# Patient Record
Sex: Female | Born: 1970 | Race: White | Hispanic: No | Marital: Married | Smoking: Never smoker
Health system: Southern US, Community
[De-identification: ages and names within clinical notes are randomized; demographics above are authoritative.]

## PROBLEM LIST (undated history)

## (undated) DIAGNOSIS — D219 Benign neoplasm of connective and other soft tissue, unspecified: Secondary | ICD-10-CM

## (undated) DIAGNOSIS — R194 Change in bowel habit: Secondary | ICD-10-CM

## (undated) DIAGNOSIS — E119 Type 2 diabetes mellitus without complications: Secondary | ICD-10-CM

## (undated) DIAGNOSIS — I10 Essential (primary) hypertension: Secondary | ICD-10-CM

## (undated) DIAGNOSIS — N949 Unspecified condition associated with female genital organs and menstrual cycle: Principal | ICD-10-CM

## (undated) DIAGNOSIS — R112 Nausea with vomiting, unspecified: Secondary | ICD-10-CM

## (undated) DIAGNOSIS — N200 Calculus of kidney: Secondary | ICD-10-CM

## (undated) DIAGNOSIS — Z9889 Other specified postprocedural states: Secondary | ICD-10-CM

## (undated) HISTORY — DX: Change in bowel habit: R19.4

## (undated) HISTORY — PX: TONSILLECTOMY AND ADENOIDECTOMY: SHX28

## (undated) HISTORY — DX: Calculus of kidney: N20.0

## (undated) HISTORY — DX: Type 2 diabetes mellitus without complications: E11.9

## (undated) HISTORY — DX: Unspecified condition associated with female genital organs and menstrual cycle: N94.9

## (undated) HISTORY — DX: Essential (primary) hypertension: I10

## (undated) HISTORY — DX: Benign neoplasm of connective and other soft tissue, unspecified: D21.9

## (undated) HISTORY — PX: APPENDECTOMY: SHX54

## (undated) HISTORY — PX: BREAST BIOPSY: SHX20

## (undated) HISTORY — PX: ENDOMETRIAL ABLATION: SHX621

---

## 2000-10-22 ENCOUNTER — Other Ambulatory Visit: Admission: RE | Admit: 2000-10-22 | Discharge: 2000-10-22 | Payer: Self-pay | Admitting: Obstetrics and Gynecology

## 2001-01-13 ENCOUNTER — Ambulatory Visit (HOSPITAL_COMMUNITY): Admission: AD | Admit: 2001-01-13 | Discharge: 2001-01-13 | Payer: Self-pay | Admitting: Obstetrics and Gynecology

## 2001-04-30 ENCOUNTER — Inpatient Hospital Stay (HOSPITAL_COMMUNITY): Admission: AD | Admit: 2001-04-30 | Discharge: 2001-05-02 | Payer: Self-pay | Admitting: Obstetrics and Gynecology

## 2001-05-31 ENCOUNTER — Ambulatory Visit (HOSPITAL_COMMUNITY): Admission: RE | Admit: 2001-05-31 | Discharge: 2001-05-31 | Payer: Self-pay | Admitting: Obstetrics and Gynecology

## 2004-08-17 ENCOUNTER — Ambulatory Visit (HOSPITAL_COMMUNITY): Admission: RE | Admit: 2004-08-17 | Discharge: 2004-08-17 | Payer: Self-pay | Admitting: Obstetrics & Gynecology

## 2005-05-19 ENCOUNTER — Ambulatory Visit (HOSPITAL_COMMUNITY): Admission: RE | Admit: 2005-05-19 | Discharge: 2005-05-19 | Payer: Self-pay | Admitting: Obstetrics & Gynecology

## 2006-06-22 ENCOUNTER — Ambulatory Visit (HOSPITAL_COMMUNITY): Admission: RE | Admit: 2006-06-22 | Discharge: 2006-06-22 | Payer: Self-pay | Admitting: Obstetrics and Gynecology

## 2007-07-23 ENCOUNTER — Other Ambulatory Visit: Admission: RE | Admit: 2007-07-23 | Discharge: 2007-07-23 | Payer: Self-pay | Admitting: Obstetrics and Gynecology

## 2008-03-15 ENCOUNTER — Emergency Department (HOSPITAL_COMMUNITY): Admission: EM | Admit: 2008-03-15 | Discharge: 2008-03-16 | Payer: Self-pay | Admitting: Emergency Medicine

## 2008-10-07 ENCOUNTER — Other Ambulatory Visit: Admission: RE | Admit: 2008-10-07 | Discharge: 2008-10-07 | Payer: Self-pay | Admitting: Obstetrics and Gynecology

## 2008-10-19 ENCOUNTER — Ambulatory Visit (HOSPITAL_COMMUNITY): Admission: RE | Admit: 2008-10-19 | Discharge: 2008-10-19 | Payer: Self-pay | Admitting: Pulmonary Disease

## 2008-10-19 ENCOUNTER — Ambulatory Visit (HOSPITAL_COMMUNITY): Admission: RE | Admit: 2008-10-19 | Discharge: 2008-10-19 | Payer: Self-pay | Admitting: Obstetrics and Gynecology

## 2010-05-30 ENCOUNTER — Ambulatory Visit (HOSPITAL_COMMUNITY): Admission: RE | Admit: 2010-05-30 | Discharge: 2010-05-30 | Payer: Self-pay | Admitting: Pulmonary Disease

## 2010-08-02 ENCOUNTER — Other Ambulatory Visit
Admission: RE | Admit: 2010-08-02 | Discharge: 2010-08-02 | Payer: Self-pay | Source: Home / Self Care | Admitting: Obstetrics and Gynecology

## 2010-08-02 ENCOUNTER — Other Ambulatory Visit: Payer: Self-pay | Admitting: Adult Health

## 2010-10-19 LAB — BLOOD GAS, ARTERIAL
Acid-base deficit: 2.8 mmol/L — ABNORMAL HIGH (ref 0.0–2.0)
Bicarbonate: 20.7 mEq/L (ref 20.0–24.0)
O2 Saturation: 98.3 %
Patient temperature: 37
TCO2: 17.6 mmol/L (ref 0–100)
pCO2 arterial: 30.8 mmHg — ABNORMAL LOW (ref 35.0–45.0)
pH, Arterial: 7.442 — ABNORMAL HIGH (ref 7.350–7.400)
pO2, Arterial: 109 mmHg — ABNORMAL HIGH (ref 80.0–100.0)

## 2010-11-25 NOTE — Op Note (Signed)
[facility]  Patient:    Dana Lynch, Dana Lynch Visit Number: [id number] MRN: [id number]          Service Type: OBS Location: 4A A427 01 Attending Physician:  Tilda Burrow Dictated by:   Hunt Oris, C.N.M. Proc. Date: 04/30/01 Admit Date:  04/30/2001                             Operative Report  PROCEDURE:  Delivery.  DETAILS OF PROCEDURE: Dana Lynch became fully dilated with an urge to push at approximately 1535.  After a brief second stage, she delivered a viable female infant at 26 with a nuchal cord and body cord x 1 which were easily reduced. The shoulders were delivered with some assistance being given utilizing traction under the posterior (left) axilla; 20 cm of cord was left attached to the baby, and the cord was doubly clamped and cut.  The infant was taken to the radiant warmer for resuscitation.  Apgars were 8/9, and weight 8 pounds 14 ounces.  Pitocin 20 units diluted in 1000 cc of lactated Ringers was then infused rapidly IV.  While awaiting the separation and expulsion of the placenta, 10 cc of 1% Xylocaine was infiltrated into a first degree perineal laceration, and repair was made using several stitches of 2-0 Vicryl. ictated by:   Hunt Oris, C.N.M. Attending Physician:  Tilda Burrow DD:  04/30/01 TD:  05/01/01 Job: [id number]

## 2010-11-25 NOTE — Op Note (Signed)
[facility]  Patient:    Dana Lynch, Dana Lynch Visit Number: [id number] MRN: [id number]          Service Type: DSU Location: DAY Attending Physician:  Tilda Burrow Dictated by:   Christin Bach, M.D. Admit Date:  05/31/2001                             Operative Report  PREOPERATIVE DIAGNOSES:  Elective sterilization.  POSTOPERATIVE DIAGNOSES:  Elective sterilization.  PROCEDURE:  Laparoscopic tubal sterilization with Falope rings.  SURGEON:  Christin Bach, M.D.  ASSISTANT:  None.  ANESTHESIA:  General -- Idacavage, C.R.N.A.  COMPLICATIONS:  None.  FINDINGS:  Normal uterus, tubes and ovaries.  INDICATION:  Elective permanent sterilization.  DETAILS OF PROCEDURE:  The patient was taken to the operating room, prepped and draped for a combined abdominal and vaginal procedure, with Hulka tenaculum attached to the cervix for uterine manipulation. Bladder in-and-out catheterization. An infraumbilical, 1 cm vertical incision, as well as a transverse suprapubic 1 cm incision. Veress needle was used to introduce pneumoperitoneum through the umbilical incision with the pneumoperitoneum easily introduced under 10 mmHg of pressure. Introduction of the Veress needle was done, carefully elevating the abdominal wall and orienting the needle toward the pelvis.  The laparoscopic trocar was then carefully introduced into the abdomen using a similar technique, and the laparoscope was used to visualize normal pelvic anatomy with no evidence of bleeding or trauma. The suprapubic trocar was introduced under direct visualization, and then attention was directed to the left fallopian tube, which was identified up to its fimbriated end, elevated and a mid-segment loop of the tube was drawn up into the Falope ring applier, Marcaine 0.25% applied to the surface of the tube and the Falope ring applied, inspected, and found to be in satisfactory position. The  opposite tube was then treated in a similar fashion. The mesosalpinx beneath the Falope ring on each side was then infiltrated with approximately 3 cc of Marcaine 0.25%, using a transabdominal approach with a 22-gauge spinal needle.  Then, the laparoscopic equipment was removed after instilling 200 cc of saline into the abdomen and deflating the abdomen. Subcuticular 4-0 Dexon was used to close the skin incisions and Steri-Strips was placed on the skin surface. Sponge and needle counts were correct. The patient tolerated the procedure well, was awakened, and went to the recovery room in good condition. Dictated by:   Christin Bach, M.D. Attending Physician:  Tilda Burrow DD:  05/31/01 TD:  05/31/01 Job: [id number]

## 2010-11-25 NOTE — H&P (Signed)
NAME:  Dana Lynch           ACCOUNT NO.:  [id number]   MEDICAL RECORD NO.:  [id number]          PATIENT TYPE:  AMB   LOCATION:  DAY                           FACILITY:  [facility]   PHYSICIAN:  Lazaro Arms, M.D.   DATE OF BIRTH:  1970/09/24   DATE OF ADMISSION:  DATE OF DISCHARGE:  LH                                HISTORY & PHYSICAL   Dana Lynch is a 40 year old white female, gravida 3, para 3, status post tubal  whom we saw several times here recently, most recently on July 19, 2004,  for increasing menometrorrhagia and dysmenorrhea.  She bleeds for 5 or 6  days clotting.  She has to wear a super maxi pad and changes them every  hour.  She soils her clothes and sheets and has to wear a towel at night.  Again, she has a great deal of cramping with it.  We discussed options of  brth control pills versus endometrial ablation, and the patient does very  poorly on birth control pills.  That is why she had a tubal.  As a result,  she is opting for endometrial ablation. She has been premedicated on Megace  and has been amenorrheic on that.   PAST MEDICAL HISTORY:  negative   PAST SURGICAL HISTORY:  Appendix, tonsils, and tubal.   PAST OB HISTORY:  Three vaginal deliveries.   REVIEW OF SYSTEMS:  Negative.   ALLERGIES:  None.   MEDICATIONS:  Megace.   REVIEW OF SYSTEMS:  Otherwise negative.   SOCIAL HISTORY:  The patient does not smoke, drink, or do drugs.  She is a  Runner, broadcasting/film/video.   PHYSICAL EXAMINATION:  HEENT:  Unremarkable.  NECK:  thyroid is normal.  LUNGS: Clear.  HEART:  Regular rate and rhythm without murmur, regurgitation, or gallop.  BREASTS:  Without mass, discharge, or skin changes.  ABDOMEN:  Benign.  No hepatosplenomegaly or masses.  PELVIC:  She has normal external genitalia.  Vagina pink and moist without  discharge.  Cervix is parous without lesions.  Uterus is normal size, shape,  and contour.  Ovaries normal and nontender.  EXTREMITIES:  Warm with no edema.  NEUROLOGIC:  Grossly intact.   IMPRESSION:  1.  Menometrorrhagia.  2.  Dysmenorrhea.   PLAN:  The patient is admitted for hysteroscopy D&C with endometrial  ablation.  She understands the risks, benefits, indications, and  alternatives and will proceed.      LHE/MEDQ  D:  08/16/2004  T:  08/16/2004  Job:  [id number]   cc:   [facility]  Fax: [phone]

## 2010-11-25 NOTE — Op Note (Signed)
Dana Lynch, Dana Lynch           ACCOUNT NO.:  [id number]   MEDICAL RECORD NO.:  [id number]          PATIENT TYPE:  AMB   LOCATION:  DAY                           FACILITY:  [facility]   PHYSICIAN:  Lazaro Arms, M.D.   DATE OF BIRTH:  1970/07/15   DATE OF PROCEDURE:  DATE OF DISCHARGE:                                 OPERATIVE REPORT   PREOPERATIVE DIAGNOSES:  1.  Menometrorrhagia.  2.  Dysmenorrhea.   POSTOPERATIVE DIAGNOSES:  1.  Menometrorrhagia.  2.  Dysmenorrhea.  3.  Endometrial polyp.   PROCEDURE:  1.  Hysteroscopy.  2.  Dilatation and curettage.  3.  Endometrial ablation.   SURGEON:  Lazaro Arms, M.D.   ANESTHESIA:  Laryngeal mask airway.   FINDINGS:  The patient had normal endometrium except for a small polyp.  It  could have been a fibroid, but may be a polyp.  The rest of the endometrial  cavity was normal.   DESCRIPTION OF PROCEDURE:  The patient was taken to the operating room and  placed in the supine position where she underwent laryngeal mask airway;  placed in the dorsal lithotomy position; prepped and draped in the usual  sterile fashion.  A Graves speculum was placed.  The cervix was grasped.  A  paracervical block using 1/2% Marcaine was placed.  The cervix was dilated  serially to allow passage of the Hydrothermablator hysteroscope.  The above-  noted findings were seen.  A vigorous curettage was performed.  All  specimens were sent to pathology for evaluation.   The Hydrothermablator was then used, taken up to 80 degrees Celsius and a  heating cycle of 10 minutes.  There was no loss of fluid during the  procedure.  The procedure went well without complications.  The instruments  were  removed.  The patient was awakened from anesthesia and taken to the recovery  room in good stable condition.  There was approximately 50 mL of blood at  the most.  The patient tolerated the procedure well.  She received Ancef and  Toradol prophylactically prior to  the procedure.      LHE/MEDQ  D:  08/17/2004  T:  08/17/2004  Job:  [id number]

## 2010-11-25 NOTE — H&P (Signed)
[facility]  Patient:    Dana Lynch, Dana Lynch Visit Number: [id number] MRN: [id number]          Service Type: OBS Location: 4A A427 01 Attending Physician:  Tilda Burrow Dictated by:   Zerita Boers, CNM Admit Date:  04/30/2001 Discharge Date: 05/02/2001                           History and Physical  ADMITTING DIAGNOSES:  Pregnancy at 37 weeks and 3 days with early labor.  HISTORY OF PRESENT ILLNESS:  Dana Lynch is a gravida 3, para 2 with a EDC of November 9 which is consistent with early ultrasound being admitted with complaints of irregular uterine contractions during the course of the night. Upon examination in the office, she is 3-4 cm, 70% effaced, -2 station. Also this morning she had an ultrasound fresh made fetal weight. Estimated fetal weight is approximately 10.2 pounds per abdominal ultrasound.  PAST MEDICAL HISTORY:  Positive for large babies. First baby was 8 pounds 11 ounces, second baby 9.5 pounds. Other than that, medical history is benign.  PAST SURGICAL HISTORY:  Positive for tonsils and appendectomy.  ALLERGIES:  She has no known allergies.  MEDICATIONS:  She is prenatal vitamins.  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  OBSTETRICAL HISTORY:  Blood type is O+, ______ is negative, rubella is immune. Hepatitis B surface antigen is negative. HIV is negative. Serology is nonreactive. GC and chlamydia are both negative. ______ within normal limits. A 28 week hemoglobin 13.7, hematocrit 40.3, one hour glucose tolerance is 102. GBS was negative. She does have a history of large babies and estimated fetal weight for this infant is 10.2 pounds per abdominal ultrasound on October 22. Prenatal course is essentially uneventful.  PHYSICAL EXAMINATION:  VITAL SIGNS:  Stable. Weight today was approximately 240 pounds. Estimated fetal weight 10.2 pounds.  PLAN:  We are going to  admit and AROM and possible Pitocin augmentation of labor. Dr. Emelda Fear was consulted and is aware of patient and he plans on being present. Report was given to oncoming nurse midwife Ella Bodo and she is aware of the patients history of macrosomic infants. Dictated by:   Zerita Boers, CNM Attending Physician:  Tilda Burrow DD:  04/30/01 TD:  04/30/01 Job: [id number]

## 2010-11-25 NOTE — Procedures (Signed)
NAMEGLADIE, Dana Lynch           ACCOUNT NO.:  [id number]   MEDICAL RECORD NO.:  [id number]          PATIENT TYPE:  OUT   LOCATION:  RESP                          FACILITY:  [facility]   PHYSICIAN:  Edward L. Juanetta Gosling, M.D.DATE OF BIRTH:  Dec 26, 1970   DATE OF PROCEDURE:  10/20/2008  DATE OF DISCHARGE:                            PULMONARY FUNCTION TEST   PULMONARY FUNCTION TESTING  1. Spirometry shows no ventilatory defect with some evidence of      airflow obstruction in the smaller airways.  2. Lung volumes are normal.  3. DLCO is normal.  4. Arterial blood gas is normal.   This study is consistent with the clinical diagnosis of asthma.      Edward L. Juanetta Gosling, M.D.  Electronically Signed     ELH/MEDQ  D:  10/20/2008  T:  10/20/2008  Job:  [id number]

## 2011-04-12 LAB — URINALYSIS, ROUTINE W REFLEX MICROSCOPIC
Glucose, UA: NEGATIVE
Leukocytes, UA: NEGATIVE
Nitrite: NEGATIVE
Protein, ur: NEGATIVE
Specific Gravity, Urine: >1.03 — ABNORMAL HIGH
Urobilinogen, UA: 1
pH: 5.5

## 2011-04-12 LAB — URINE MICROSCOPIC-ADD ON

## 2011-08-07 ENCOUNTER — Other Ambulatory Visit: Payer: Self-pay | Admitting: Adult Health

## 2011-08-07 ENCOUNTER — Other Ambulatory Visit (HOSPITAL_COMMUNITY)
Admission: RE | Admit: 2011-08-07 | Discharge: 2011-08-07 | Disposition: A | Discharge status: Home / Self Care | Payer: BC Managed Care – PPO | Source: Ambulatory Visit | Attending: Obstetrics and Gynecology | Admitting: Obstetrics and Gynecology

## 2011-08-07 DIAGNOSIS — Z01419 Encounter for gynecological examination (general) (routine) without abnormal findings: Secondary | ICD-10-CM | POA: Insufficient documentation

## 2011-08-11 ENCOUNTER — Other Ambulatory Visit: Payer: Self-pay | Admitting: Adult Health

## 2011-08-11 DIAGNOSIS — Z139 Encounter for screening, unspecified: Secondary | ICD-10-CM

## 2011-08-21 ENCOUNTER — Ambulatory Visit (HOSPITAL_COMMUNITY)
Admission: RE | Admit: 2011-08-21 | Discharge: 2011-08-21 | Disposition: A | Discharge status: Home / Self Care | Payer: BC Managed Care – PPO | Source: Ambulatory Visit | Attending: Adult Health | Admitting: Adult Health

## 2011-08-21 DIAGNOSIS — Z1231 Encounter for screening mammogram for malignant neoplasm of breast: Secondary | ICD-10-CM | POA: Insufficient documentation

## 2011-08-21 DIAGNOSIS — Z139 Encounter for screening, unspecified: Secondary | ICD-10-CM

## 2012-10-09 ENCOUNTER — Other Ambulatory Visit: Payer: Self-pay | Admitting: Obstetrics and Gynecology

## 2012-10-09 DIAGNOSIS — Z139 Encounter for screening, unspecified: Secondary | ICD-10-CM

## 2012-10-14 ENCOUNTER — Ambulatory Visit (HOSPITAL_COMMUNITY)
Admission: RE | Admit: 2012-10-14 | Discharge: 2012-10-14 | Disposition: A | Discharge status: Home / Self Care | Payer: BC Managed Care – PPO | Source: Ambulatory Visit | Attending: Obstetrics and Gynecology | Admitting: Obstetrics and Gynecology

## 2012-10-14 DIAGNOSIS — Z1231 Encounter for screening mammogram for malignant neoplasm of breast: Secondary | ICD-10-CM | POA: Insufficient documentation

## 2012-10-14 DIAGNOSIS — Z139 Encounter for screening, unspecified: Secondary | ICD-10-CM

## 2013-05-28 ENCOUNTER — Other Ambulatory Visit (HOSPITAL_COMMUNITY): Payer: Self-pay | Admitting: Pulmonary Disease

## 2013-05-28 DIAGNOSIS — J329 Chronic sinusitis, unspecified: Secondary | ICD-10-CM

## 2013-05-30 ENCOUNTER — Other Ambulatory Visit (HOSPITAL_COMMUNITY): Payer: Self-pay | Admitting: Pulmonary Disease

## 2013-05-30 ENCOUNTER — Ambulatory Visit (HOSPITAL_COMMUNITY)
Admission: RE | Admit: 2013-05-30 | Discharge: 2013-05-30 | Disposition: A | Discharge status: Home / Self Care | Payer: BC Managed Care – PPO | Source: Ambulatory Visit | Attending: Pulmonary Disease | Admitting: Pulmonary Disease

## 2013-05-30 DIAGNOSIS — R059 Cough, unspecified: Secondary | ICD-10-CM | POA: Insufficient documentation

## 2013-05-30 DIAGNOSIS — R05 Cough: Secondary | ICD-10-CM

## 2013-05-30 DIAGNOSIS — R51 Headache: Secondary | ICD-10-CM | POA: Insufficient documentation

## 2013-05-30 DIAGNOSIS — J3489 Other specified disorders of nose and nasal sinuses: Secondary | ICD-10-CM | POA: Insufficient documentation

## 2013-05-30 DIAGNOSIS — J329 Chronic sinusitis, unspecified: Secondary | ICD-10-CM

## 2013-07-31 ENCOUNTER — Ambulatory Visit (INDEPENDENT_AMBULATORY_CARE_PROVIDER_SITE_OTHER): Payer: BC Managed Care – PPO | Admitting: Otolaryngology

## 2013-07-31 DIAGNOSIS — H6120 Impacted cerumen, unspecified ear: Secondary | ICD-10-CM

## 2013-07-31 DIAGNOSIS — J343 Hypertrophy of nasal turbinates: Secondary | ICD-10-CM

## 2013-07-31 DIAGNOSIS — J310 Chronic rhinitis: Secondary | ICD-10-CM

## 2013-12-22 ENCOUNTER — Ambulatory Visit (INDEPENDENT_AMBULATORY_CARE_PROVIDER_SITE_OTHER): Payer: BC Managed Care – PPO

## 2013-12-22 ENCOUNTER — Encounter: Payer: Self-pay | Admitting: Adult Health

## 2013-12-22 ENCOUNTER — Other Ambulatory Visit: Payer: Self-pay | Admitting: Adult Health

## 2013-12-22 ENCOUNTER — Ambulatory Visit (INDEPENDENT_AMBULATORY_CARE_PROVIDER_SITE_OTHER): Payer: BC Managed Care – PPO | Admitting: Adult Health

## 2013-12-22 VITALS — BP 138/80 | Ht 70.0 in | Wt 253.0 lb

## 2013-12-22 DIAGNOSIS — D219 Benign neoplasm of connective and other soft tissue, unspecified: Secondary | ICD-10-CM

## 2013-12-22 DIAGNOSIS — R198 Other specified symptoms and signs involving the digestive system and abdomen: Secondary | ICD-10-CM

## 2013-12-22 DIAGNOSIS — N949 Unspecified condition associated with female genital organs and menstrual cycle: Secondary | ICD-10-CM

## 2013-12-22 DIAGNOSIS — R102 Pelvic and perineal pain: Secondary | ICD-10-CM

## 2013-12-22 DIAGNOSIS — R194 Change in bowel habit: Secondary | ICD-10-CM | POA: Insufficient documentation

## 2013-12-22 DIAGNOSIS — D259 Leiomyoma of uterus, unspecified: Secondary | ICD-10-CM

## 2013-12-22 HISTORY — DX: Change in bowel habit: R19.4

## 2013-12-22 HISTORY — DX: Benign neoplasm of connective and other soft tissue, unspecified: D21.9

## 2013-12-22 HISTORY — DX: Unspecified condition associated with female genital organs and menstrual cycle: N94.9

## 2013-12-22 NOTE — Patient Instructions (Signed)
Pelvic Pain, Female Female pelvic pain can be caused by many different things and start from a variety of places. Pelvic pain refers to pain that is located in the lower half of the abdomen and between your hips. The pain may occur over a short period of time (acute) or may be reoccurring (chronic). The cause of pelvic pain may be related to disorders affecting the female reproductive organs (gynecologic), but it may also be related to the bladder, kidney stones, an intestinal complication, or muscle or skeletal problems. Getting help right away for pelvic pain is important, especially if there has been severe, sharp, or a sudden onset of unusual pain. It is also important to get help right away because some types of pelvic pain can be life threatening.  CAUSES  Below are only some of the causes of pelvic pain. The causes of pelvic pain can be in one of several categories.   Gynecologic.  Pelvic inflammatory disease.  Sexually transmitted infection.  Ovarian cyst or a twisted ovarian ligament (ovarian torsion).  Uterine lining that grows outside the uterus (endometriosis).  Fibroids, cysts, or tumors.  Ovulation.  Pregnancy.  Pregnancy that occurs outside the uterus (ectopic pregnancy).  Miscarriage.  Labor.  Abruption of the placenta or ruptured uterus.  Infection.  Uterine infection (endometritis).  Bladder infection.  Diverticulitis.  Miscarriage related to a uterine infection (septic abortion).  Bladder.  Inflammation of the bladder (cystitis).  Kidney stone(s).  Gastrointenstinal.  Constipation.  Diverticulitis.  Neurologic.  Trauma.  Feeling pelvic pain because of mental or emotional causes (psychosomatic).  Cancers of the bowel or pelvis. EVALUATION  Your caregiver will want to take a careful history of your concerns. This includes recent changes in your health, a careful gynecologic history of your periods (menses), and a sexual history. Obtaining  your family history and medical history is also important. Your caregiver may suggest a pelvic exam. A pelvic exam will help identify the location and severity of the pain. It also helps in the evaluation of which organ system may be involved. In order to identify the cause of the pelvic pain and be properly treated, your caregiver may order tests. These tests may include:   A pregnancy test.  Pelvic ultrasonography.  An X-ray exam of the abdomen.  A urinalysis or evaluation of vaginal discharge.  Blood tests. HOME CARE INSTRUCTIONS   Only take over-the-counter or prescription medicines for pain, discomfort, or fever as directed by your caregiver.   Rest as directed by your caregiver.   Eat a balanced diet.   Drink enough fluids to make your urine clear or pale yellow, or as directed.   Avoid sexual intercourse if it causes pain.   Apply warm or cold compresses to the lower abdomen depending on which one helps the pain.   Avoid stressful situations.   Keep a journal of your pelvic pain. Write down when it started, where the pain is located, and if there are things that seem to be associated with the pain, such as food or your menstrual cycle.  Follow up with your caregiver as directed.  SEEK MEDICAL CARE IF:  Your medicine does not help your pain.  You have abnormal vaginal discharge. SEEK IMMEDIATE MEDICAL CARE IF:   You have heavy bleeding from the vagina.   Your pelvic pain increases.   You feel lightheaded or faint.   You have chills.   You have pain with urination or blood in your urine.   You have uncontrolled  diarrhea or vomiting.   You have a fever or persistent symptoms for more than 3 days.  You have a fever and your symptoms suddenly get worse.   You are being physically or sexually abused.  MAKE SURE YOU:  Understand these instructions.  Will watch your condition.  Will get help if you are not doing well or get worse. Document  Released: 05/23/2004 Document Revised: 12/26/2011 Document Reviewed: 10/16/2011 [facility] Patient Information 2014 [place], [place]. Refer to GI Return in 3 weeks for Pap and physical Keep pain diary and BM too

## 2013-12-22 NOTE — Progress Notes (Signed)
Subjective:     Patient ID: Dana Lynch, female   DOB: [birth year], 43 y.o.   MRN: [id number]  HPI Dana Lynch is a 43 year old white female,in complaining of pain across pelvis more in LLQ,  like a grinding Sensation, and has had bowel changes of alternating constipation and diarrhea, but not associated with meals.No rectal mucous or blood. Review of Systems See HPI Reviewed past medical,surgical, social and family history. Reviewed medications and allergies.     Objective:   Physical Exam BP 138/80  Ht 5\' 10"  (1.778 m)  Wt 253 lb (114.76 kg)  BMI 36.30 kg/m2  LMP 12/01/2013   Skin warm and dry.Pelvic: external genitalia is normal in appearance, vagina:good color,moisture and rugae,  cervix:smooth and bulbous, uterus: normal size, shape and contour, non tender, no masses felt, adnexa: no masses, LLQ tenderness noted, got [place] which showed:Uterus 11.5 x 9.2 x 7.5 cm, anteverted with multiple fibroids noted largest 2 =54 & 78mm  Endometrium 4.1 mm, deviated by fibroids  Right ovary 2.7 x 1.7 x 1.2 cm,  Left ovary 4.0 x 2.0 x 2.0 cm,  No free fluid or adnexal masses noted within the pelvis  Technician Comments:  Anteverted uterus noted with multiple fibroids noted within, Endometrium deviated by fibroids, bilateral adnexa appears WNL no free fluid or adnexal masses noted within the pelvis  Discussed with Dr Glo Herring and he doesn't think the fibroids are the source of pain, we both think it may be bowel related.   Assessment:     Pelvic pain esp LLQ Fibroids  Bowel changes, alternates constipation and diarrhea    Plan:    Keep pain and bowel diary  Refer to [facility], Neil Crouch PA to evaluate Return in 3 weeks for pap and physical   Review handout on pelvic pain

## 2013-12-25 ENCOUNTER — Encounter: Payer: Self-pay | Admitting: Internal Medicine

## 2014-01-01 ENCOUNTER — Ambulatory Visit: Payer: BC Managed Care – PPO | Admitting: Gastroenterology

## 2014-01-12 ENCOUNTER — Other Ambulatory Visit: Payer: Self-pay | Admitting: Obstetrics and Gynecology

## 2014-01-12 DIAGNOSIS — Z1231 Encounter for screening mammogram for malignant neoplasm of breast: Secondary | ICD-10-CM

## 2014-01-14 ENCOUNTER — Other Ambulatory Visit: Payer: Self-pay | Admitting: Adult Health

## 2014-01-14 ENCOUNTER — Ambulatory Visit (INDEPENDENT_AMBULATORY_CARE_PROVIDER_SITE_OTHER): Payer: BC Managed Care – PPO | Admitting: Adult Health

## 2014-01-14 ENCOUNTER — Other Ambulatory Visit (HOSPITAL_COMMUNITY)
Admission: RE | Admit: 2014-01-14 | Discharge: 2014-01-14 | Disposition: A | Discharge status: Home / Self Care | Payer: BC Managed Care – PPO | Source: Ambulatory Visit | Attending: Adult Health | Admitting: Adult Health

## 2014-01-14 ENCOUNTER — Encounter: Payer: Self-pay | Admitting: Adult Health

## 2014-01-14 VITALS — BP 122/78 | HR 76 | Ht 66.0 in | Wt 247.0 lb

## 2014-01-14 DIAGNOSIS — Z01419 Encounter for gynecological examination (general) (routine) without abnormal findings: Secondary | ICD-10-CM

## 2014-01-14 DIAGNOSIS — Z1151 Encounter for screening for human papillomavirus (HPV): Secondary | ICD-10-CM | POA: Insufficient documentation

## 2014-01-14 DIAGNOSIS — I10 Essential (primary) hypertension: Secondary | ICD-10-CM

## 2014-01-14 DIAGNOSIS — Z1212 Encounter for screening for malignant neoplasm of rectum: Secondary | ICD-10-CM

## 2014-01-14 LAB — COMPREHENSIVE METABOLIC PANEL
ALT: 56 U/L — ABNORMAL HIGH (ref 0–35)
AST: 38 U/L — ABNORMAL HIGH (ref 0–37)
Albumin: 4.4 g/dL (ref 3.5–5.2)
Alkaline Phosphatase: 56 U/L (ref 39–117)
BUN: 7 mg/dL (ref 6–23)
CO2: 23 mEq/L (ref 19–32)
Calcium: 9.4 mg/dL (ref 8.4–10.5)
Chloride: 106 mEq/L (ref 96–112)
Creat: 0.6 mg/dL (ref 0.50–1.10)
Glucose, Bld: 196 mg/dL — ABNORMAL HIGH (ref 70–99)
Potassium: 4.1 mEq/L (ref 3.5–5.3)
Sodium: 138 mEq/L (ref 135–145)
Total Bilirubin: 0.6 mg/dL (ref 0.2–1.2)
Total Protein: 6.6 g/dL (ref 6.0–8.3)

## 2014-01-14 LAB — CBC
HCT: 45.8 % (ref 36.0–46.0)
Hemoglobin: 16.4 g/dL — ABNORMAL HIGH (ref 12.0–15.0)
MCH: 30.7 pg (ref 26.0–34.0)
MCHC: 35.8 g/dL (ref 30.0–36.0)
MCV: 85.6 fL (ref 78.0–100.0)
Platelets: 259 10*3/uL (ref 150–400)
RBC: 5.35 MIL/uL — ABNORMAL HIGH (ref 3.87–5.11)
RDW: 13.3 % (ref 11.5–15.5)
WBC: 7.3 10*3/uL (ref 4.0–10.5)

## 2014-01-14 LAB — LIPID PANEL
Cholesterol: 182 mg/dL (ref 0–200)
HDL: 36 mg/dL — ABNORMAL LOW (ref >39)
LDL Cholesterol: 130 mg/dL — ABNORMAL HIGH (ref 0–99)
Total CHOL/HDL Ratio: 5.1 Ratio
Triglycerides: 80 mg/dL (ref <150)
VLDL: 16 mg/dL (ref 0–40)

## 2014-01-14 LAB — HEMOCCULT GUIAC POC 1CARD (OFFICE): Fecal Occult Blood, POC: NEGATIVE

## 2014-01-14 LAB — TSH: TSH: 1.625 u[IU]/mL (ref 0.350–4.500)

## 2014-01-14 MED ORDER — HYDROCHLOROTHIAZIDE 25 MG PO TABS: 25.0000 mg | ORAL_TABLET | Freq: Every day | ORAL | Status: DC

## 2014-01-14 NOTE — Progress Notes (Signed)
Patient ID: Dana Lynch, female   DOB: August 08, 1970, 43 y.o.   MRN: [id number] History of Present Illness: Dana Lynch is a 43 year old white female,married, in for a pap and physical.She says her pain has resolved and bowels have been normal, has not scheduled appt with GI yet.   Current Medications, Allergies, Past Medical History, Past Surgical History, Family History and Social History were reviewed in [employer] record.     Review of Systems: Patient denies any headaches, blurred vision, shortness of breath, chest pain, abdominal pain, problems with bowel movements, urination, or intercourse.  No joint pain or mood swings, just got back from beach and got great time.   Physical Exam:BP 122/78  Pulse 76  Ht 5\' 6"  (1.676 m)  Wt 247 lb (112.038 kg)  BMI 39.89 kg/m2  LMP 12/27/2013 General:  Well developed, well nourished, no acute distress Skin:  Warm and dry Neck:  Midline trachea, normal thyroid Lungs; Clear to auscultation bilaterally Breast:  No dominant palpable mass, retraction, or nipple discharge Cardiovascular: Regular rate and rhythm Abdomen:  Soft, non tender, no hepatosplenomegaly Pelvic:  External genitalia is normal in appearance.  The vagina is normal in appearance.  The cervix is bulbous.Pap with HPV performed.  Uterus is felt to be normal size, shape, and contour.  No                adnexal masses or tenderness noted. Rectal: Good sphincter tone, no polyps, or hemorrhoids felt.  Hemoccult negative. Extremities:  No swelling or varicosities noted Psych:  No mood changes, alert and cooperative,seems happy   Impression: Yearly gyn exam  Hypertension   Plan: Physical in 1 year Mammogram in am and yearly Check CBC,CMP,TSH and lipids Colonoscopy per GI or at 50 Refilled hydrodiuril 25 ng 1 daily #90 with 4 refills

## 2014-01-14 NOTE — Patient Instructions (Signed)
Physical in 1 year Mammogram in am and yearly  Colonoscopy Per GI

## 2014-01-15 ENCOUNTER — Ambulatory Visit (HOSPITAL_COMMUNITY)
Admission: RE | Admit: 2014-01-15 | Discharge: 2014-01-15 | Disposition: A | Discharge status: Home / Self Care | Payer: BC Managed Care – PPO | Source: Ambulatory Visit | Attending: Obstetrics and Gynecology | Admitting: Obstetrics and Gynecology

## 2014-01-15 DIAGNOSIS — Z1231 Encounter for screening mammogram for malignant neoplasm of breast: Secondary | ICD-10-CM | POA: Insufficient documentation

## 2014-01-15 LAB — HEMOGLOBIN A1C
HEMOGLOBIN A1C: 7 % — AB (ref <5.7)
MEAN PLASMA GLUCOSE: 154 mg/dL — AB (ref <117)

## 2014-01-16 ENCOUNTER — Telehealth: Payer: Self-pay | Admitting: Adult Health

## 2014-01-16 DIAGNOSIS — E119 Type 2 diabetes mellitus without complications: Secondary | ICD-10-CM

## 2014-01-16 LAB — CYTOLOGY - PAP

## 2014-01-16 MED ORDER — METFORMIN HCL 500 MG PO TABS: 500.0000 mg | ORAL_TABLET | Freq: Two times a day (BID) | ORAL | Status: DC

## 2014-01-16 NOTE — Telephone Encounter (Signed)
Pt aware of labs and pap and that she is diabetic, FBS 196 and A1c 7, and HDL 36, LDL 130 needs diabetic counseling and increase exercise, decrease carbs and take metformin 500 mg 1 bid will follow up in  4 weeks in office, get eye exam

## 2014-01-29 ENCOUNTER — Ambulatory Visit: Payer: BC Managed Care – PPO

## 2014-02-05 ENCOUNTER — Ambulatory Visit: Payer: BC Managed Care – PPO

## 2014-02-12 ENCOUNTER — Ambulatory Visit: Payer: BC Managed Care – PPO

## 2014-02-13 ENCOUNTER — Ambulatory Visit: Payer: BC Managed Care – PPO | Admitting: Adult Health

## 2014-02-16 ENCOUNTER — Encounter: Payer: Self-pay | Admitting: Adult Health

## 2014-02-16 ENCOUNTER — Ambulatory Visit (INDEPENDENT_AMBULATORY_CARE_PROVIDER_SITE_OTHER): Payer: BC Managed Care – PPO | Admitting: Adult Health

## 2014-02-16 VITALS — BP 120/70 | Ht 67.0 in | Wt 246.5 lb

## 2014-02-16 DIAGNOSIS — E1165 Type 2 diabetes mellitus with hyperglycemia: Secondary | ICD-10-CM

## 2014-02-16 DIAGNOSIS — IMO0001 Reserved for inherently not codable concepts without codable children: Secondary | ICD-10-CM | POA: Insufficient documentation

## 2014-02-16 DIAGNOSIS — E119 Type 2 diabetes mellitus without complications: Secondary | ICD-10-CM

## 2014-02-16 HISTORY — DX: Type 2 diabetes mellitus without complications: E11.9

## 2014-02-16 LAB — POCT CBG (FASTING - GLUCOSE)-MANUAL ENTRY: GLUCOSE FASTING, POC: 144 mg/dL — AB (ref 70–99)

## 2014-02-16 NOTE — Patient Instructions (Signed)
Diabetes Mellitus and Food It is important for you to manage your blood sugar (glucose) level. Your blood glucose level can be greatly affected by what you eat. Eating healthier foods in the appropriate amounts throughout the day at about the same time each day will help you control your blood glucose level. It can also help slow or prevent worsening of your diabetes mellitus. Healthy eating may even help you improve the level of your blood pressure and reach or maintain a healthy weight.  HOW CAN FOOD AFFECT ME? Carbohydrates Carbohydrates affect your blood glucose level more than any other type of food. Your dietitian will help you determine how many carbohydrates to eat at each meal and teach you how to count carbohydrates. Counting carbohydrates is important to keep your blood glucose at a healthy level, especially if you are using insulin or taking certain medicines for diabetes mellitus. Alcohol Alcohol can cause sudden decreases in blood glucose (hypoglycemia), especially if you use insulin or take certain medicines for diabetes mellitus. Hypoglycemia can be a life-threatening condition. Symptoms of hypoglycemia (sleepiness, dizziness, and disorientation) are similar to symptoms of having too much alcohol.  If your health care provider has given you approval to drink alcohol, do so in moderation and use the following guidelines:  Women should not have more than one drink per day, and men should not have more than two drinks per day. One drink is equal to:  12 oz of beer.  5 oz of wine.  1 oz of hard liquor.  Do not drink on an empty stomach.  Keep yourself hydrated. Have water, diet soda, or unsweetened iced tea.  Regular soda, juice, and other mixers might contain a lot of carbohydrates and should be counted. WHAT FOODS ARE NOT RECOMMENDED? As you make food choices, it is important to remember that all foods are not the same. Some foods have fewer nutrients per serving than other  foods, even though they might have the same number of calories or carbohydrates. It is difficult to get your body what it needs when you eat foods with fewer nutrients. Examples of foods that you should avoid that are high in calories and carbohydrates but low in nutrients include:  Trans fats (most processed foods list trans fats on the Nutrition Facts label).  Regular soda.  Juice.  Candy.  Sweets, such as cake, pie, doughnuts, and cookies.  Fried foods. WHAT FOODS CAN I EAT? Have nutrient-rich foods, which will nourish your body and keep you healthy. The food you should eat also will depend on several factors, including:  The calories you need.  The medicines you take.  Your weight.  Your blood glucose level.  Your blood pressure level.  Your cholesterol level. You also should eat a variety of foods, including:  Protein, such as meat, poultry, fish, tofu, nuts, and seeds (lean animal proteins are best).  Fruits.  Vegetables.  Dairy products, such as milk, cheese, and yogurt (low fat is best).  Breads, grains, pasta, cereal, rice, and beans.  Fats such as olive oil, trans fat-free margarine, canola oil, avocado, and olives. DOES EVERYONE WITH DIABETES MELLITUS HAVE THE SAME MEAL PLAN? Because every person with diabetes mellitus is different, there is not one meal plan that works for everyone. It is very important that you meet with a dietitian who will help you create a meal plan that is just right for you. Document Released: 03/23/2005 Document Revised: 07/01/2013 Document Reviewed: 05/23/2013 [employer] Patient Information 2015 [employer]. This   information is not intended to replace advice given to you by your health care provider. Make sure you discuss any questions you have with your health care provider. Follow up in 2 months for labs  Get eye exam

## 2014-02-16 NOTE — Progress Notes (Signed)
Subjective:     Patient ID: Dana Lynch, female   DOB: 10/19/1970, 43 y.o.   MRN: [id number]  HPI Dana Lynch is a 43 year old white female in to discuss recent diagnosis of diabetes.  Review of Systems See HPI Reviewed past medical,surgical, social and family history. Reviewed medications and allergies.     Objective:   Physical Exam BP 120/70  Ht 5\' 7"  (1.702 m)  Wt 246 lb 8 oz (111.812 kg)  BMI 38.60 kg/m2  LMP 02/11/2014   Reviewed labs again, and discussed weight loss and cutting carbs, she lost 1/2 lb,cancelled appt for dietician.  Assessment:    Diabetes    Plan:    Get eye exam Cut carbs Follow up in 2 months for CMP,A1c and lipids with micro albumin of urine Continue weight loss efforts  Reschedule appt with dietician

## 2014-04-20 ENCOUNTER — Other Ambulatory Visit: Payer: BC Managed Care – PPO

## 2014-04-20 DIAGNOSIS — E119 Type 2 diabetes mellitus without complications: Secondary | ICD-10-CM

## 2014-04-20 LAB — COMPREHENSIVE METABOLIC PANEL
ALBUMIN: 4 g/dL (ref 3.5–5.2)
ALT: 40 U/L — ABNORMAL HIGH (ref 0–35)
AST: 23 U/L (ref 0–37)
Alkaline Phosphatase: 52 U/L (ref 39–117)
BUN: 9 mg/dL (ref 6–23)
CALCIUM: 8.9 mg/dL (ref 8.4–10.5)
CO2: 24 meq/L (ref 19–32)
Chloride: 107 mEq/L (ref 96–112)
Creat: 0.69 mg/dL (ref 0.50–1.10)
GLUCOSE: 134 mg/dL — AB (ref 70–99)
Potassium: 4.4 mEq/L (ref 3.5–5.3)
SODIUM: 140 meq/L (ref 135–145)
Total Bilirubin: 0.4 mg/dL (ref 0.2–1.2)
Total Protein: 6.3 g/dL (ref 6.0–8.3)

## 2014-04-20 LAB — LIPID PANEL
CHOLESTEROL: 163 mg/dL (ref 0–200)
HDL: 35 mg/dL — ABNORMAL LOW (ref >39)
LDL Cholesterol: 114 mg/dL — ABNORMAL HIGH (ref 0–99)
Total CHOL/HDL Ratio: 4.7 Ratio
Triglycerides: 70 mg/dL (ref <150)
VLDL: 14 mg/dL (ref 0–40)

## 2014-04-20 LAB — CBC
HEMATOCRIT: 42.5 % (ref 36.0–46.0)
Hemoglobin: 15.2 g/dL — ABNORMAL HIGH (ref 12.0–15.0)
MCH: 29.9 pg (ref 26.0–34.0)
MCHC: 35.8 g/dL (ref 30.0–36.0)
MCV: 83.7 fL (ref 78.0–100.0)
PLATELETS: 289 10*3/uL (ref 150–400)
RBC: 5.08 MIL/uL (ref 3.87–5.11)
RDW: 12.6 % (ref 11.5–15.5)
WBC: 6.4 10*3/uL (ref 4.0–10.5)

## 2014-04-20 LAB — HEMOGLOBIN A1C
HEMOGLOBIN A1C: 6.7 % — AB (ref <5.7)
MEAN PLASMA GLUCOSE: 146 mg/dL — AB (ref <117)

## 2014-04-20 NOTE — Addendum Note (Signed)
Addended by: Doyne Keel on: 04/20/2014 09:39 AM   Modules accepted: Orders

## 2014-04-21 LAB — MICROALBUMIN, URINE: Microalb, Ur: 0.6 mg/dL (ref <2.0)

## 2014-04-23 ENCOUNTER — Telehealth: Payer: Self-pay | Admitting: Adult Health

## 2014-04-23 NOTE — Telephone Encounter (Signed)
Left message to call about labs 

## 2014-04-27 ENCOUNTER — Telehealth: Payer: Self-pay | Admitting: Adult Health

## 2014-04-27 NOTE — Telephone Encounter (Signed)
Left message to call about labs 

## 2014-04-28 ENCOUNTER — Telehealth: Payer: Self-pay | Admitting: Adult Health

## 2014-04-29 ENCOUNTER — Telehealth: Payer: Self-pay | Admitting: Adult Health

## 2014-04-29 NOTE — Telephone Encounter (Signed)
left message to call about labs

## 2014-04-29 NOTE — Telephone Encounter (Signed)
error 

## 2014-05-05 ENCOUNTER — Encounter: Payer: Self-pay | Admitting: Adult Health

## 2014-05-11 ENCOUNTER — Encounter: Payer: Self-pay | Admitting: Adult Health

## 2015-01-06 ENCOUNTER — Other Ambulatory Visit: Payer: Self-pay | Admitting: Adult Health

## 2015-01-06 DIAGNOSIS — Z1231 Encounter for screening mammogram for malignant neoplasm of breast: Secondary | ICD-10-CM

## 2015-01-18 ENCOUNTER — Ambulatory Visit (HOSPITAL_COMMUNITY)
Admission: RE | Admit: 2015-01-18 | Discharge: 2015-01-18 | Disposition: A | Discharge status: Home / Self Care | Payer: BC Managed Care – PPO | Source: Ambulatory Visit | Attending: Adult Health | Admitting: Adult Health

## 2015-01-18 DIAGNOSIS — Z1231 Encounter for screening mammogram for malignant neoplasm of breast: Secondary | ICD-10-CM | POA: Diagnosis present

## 2015-02-16 ENCOUNTER — Encounter: Payer: Self-pay | Admitting: Adult Health

## 2015-02-16 ENCOUNTER — Ambulatory Visit (INDEPENDENT_AMBULATORY_CARE_PROVIDER_SITE_OTHER): Payer: BC Managed Care – PPO | Admitting: Adult Health

## 2015-02-16 VITALS — BP 138/82 | HR 92 | Ht 65.5 in | Wt 244.0 lb

## 2015-02-16 DIAGNOSIS — Z01419 Encounter for gynecological examination (general) (routine) without abnormal findings: Secondary | ICD-10-CM | POA: Diagnosis not present

## 2015-02-16 DIAGNOSIS — E119 Type 2 diabetes mellitus without complications: Secondary | ICD-10-CM

## 2015-02-16 DIAGNOSIS — Z1212 Encounter for screening for malignant neoplasm of rectum: Secondary | ICD-10-CM | POA: Diagnosis not present

## 2015-02-16 DIAGNOSIS — D259 Leiomyoma of uterus, unspecified: Secondary | ICD-10-CM

## 2015-02-16 DIAGNOSIS — I10 Essential (primary) hypertension: Secondary | ICD-10-CM

## 2015-02-16 LAB — HEMOCCULT GUIAC POC 1CARD (OFFICE): FECAL OCCULT BLD: NEGATIVE

## 2015-02-16 NOTE — Progress Notes (Signed)
Patient ID: Dana Lynch, female   DOB: [birth year], 44 y.o.   MRN: [id number] History of Present Illness: Dana Lynch is a 44 year old white female, married in for well woman gyn exam,her last pap was 01/14/14 and was normal with negative HPV.She did not see nutritionist, says she knows what to do just does not always do and has not taken meds as prescribed.She has gotten mammogram and seen eye doctor.She still has irregular spotting since ablation.   Current Medications, Allergies, Past Medical History, Past Surgical History, Family History and Social History were reviewed in [employer] record.     Review of Systems: Patient denies any headaches, hearing loss, fatigue, blurred vision, shortness of breath, chest pain, abdominal pain, problems with bowel movements, urination, or intercourse. No joint pain or mood swings.Denies any pain or burning in feet or legs, see HPI for positives.    Physical Exam:BP 138/82 mmHg  Pulse 92  Ht 5' 5.5" (1.664 m)  Wt 244 lb (110.678 kg)  BMI 39.97 kg/m2  LMP 02/08/2015 General:  Well developed, well nourished, no acute distress Skin:  Warm and dry Neck:  Midline trachea, normal thyroid, good ROM, no lymphadenopathy Lungs; Clear to auscultation bilaterally Breast:  No dominant palpable mass, retraction, or nipple discharge Cardiovascular: Regular rate and rhythm Abdomen:  Soft, non tender, no hepatosplenomegaly Pelvic:  External genitalia is normal in appearance, no lesions.  The vagina is normal in appearance. Urethra has no lesions or masses. The cervix is bulbous.  Uterus is felt to be enlarged in size(has known fibroids), shape, and contour.  No adnexal masses or tenderness noted.Bladder is non tender, no masses felt. Rectal: Good sphincter tone, no polyps, or hemorrhoids felt.  Hemoccult negative. Extremities/musculoskeletal:  No swelling or varicosities noted, no clubbing or cyanosis Psych:  No mood changes, alert and  cooperative,seems happy   Impression: Well woman gyn exam no pap Type 2 diabetes Hypertension Fibroids    Plan: Check CBC,CMP,TSH and lipids,A1c and micro albumin level  Physical in 1 year Mammogram yearly A1c in 3 months Continue meds, try to take as prescribed, will talk when labs back

## 2015-02-16 NOTE — Patient Instructions (Signed)
Physical in 1 year Mammogram yearly A1c in 3 months

## 2015-02-17 LAB — LIPID PANEL
CHOLESTEROL TOTAL: 198 mg/dL (ref 100–199)
Chol/HDL Ratio: 5.8 ratio units — ABNORMAL HIGH (ref 0.0–4.4)
HDL: 34 mg/dL — ABNORMAL LOW (ref >39)
LDL CALC: 124 mg/dL — AB (ref 0–99)
Triglycerides: 200 mg/dL — ABNORMAL HIGH (ref 0–149)
VLDL Cholesterol Cal: 40 mg/dL (ref 5–40)

## 2015-02-17 LAB — CBC
Hematocrit: 46.2 % (ref 34.0–46.6)
Hemoglobin: 15.8 g/dL (ref 11.1–15.9)
MCH: 29.5 pg (ref 26.6–33.0)
MCHC: 34.2 g/dL (ref 31.5–35.7)
MCV: 86 fL (ref 79–97)
PLATELETS: 312 10*3/uL (ref 150–379)
RBC: 5.36 x10E6/uL — ABNORMAL HIGH (ref 3.77–5.28)
RDW: 13 % (ref 12.3–15.4)
WBC: 8.9 10*3/uL (ref 3.4–10.8)

## 2015-02-17 LAB — COMPREHENSIVE METABOLIC PANEL
ALK PHOS: 60 IU/L (ref 39–117)
ALT: 43 IU/L — AB (ref 0–32)
AST: 26 IU/L (ref 0–40)
Albumin/Globulin Ratio: 2.1 (ref 1.1–2.5)
Albumin: 4.6 g/dL (ref 3.5–5.5)
BILIRUBIN TOTAL: 0.4 mg/dL (ref 0.0–1.2)
BUN / CREAT RATIO: 15 (ref 9–23)
BUN: 10 mg/dL (ref 6–24)
CHLORIDE: 99 mmol/L (ref 97–108)
CO2: 22 mmol/L (ref 18–29)
Calcium: 9.7 mg/dL (ref 8.7–10.2)
Creatinine, Ser: 0.66 mg/dL (ref 0.57–1.00)
GFR calc non Af Amer: 106 mL/min/{1.73_m2} (ref >59)
GFR, EST AFRICAN AMERICAN: 123 mL/min/{1.73_m2} (ref >59)
Globulin, Total: 2.2 g/dL (ref 1.5–4.5)
Glucose: 136 mg/dL — ABNORMAL HIGH (ref 65–99)
Potassium: 4.3 mmol/L (ref 3.5–5.2)
Sodium: 142 mmol/L (ref 134–144)
Total Protein: 6.8 g/dL (ref 6.0–8.5)

## 2015-02-17 LAB — TSH: TSH: 1.01 u[IU]/mL (ref 0.450–4.500)

## 2015-02-17 LAB — MICROALBUMIN / CREATININE URINE RATIO
Creatinine, Urine: 106.9 mg/dL
MICROALB/CREAT RATIO: 5.8 mg/g{creat} (ref 0.0–30.0)
MICROALBUM., U, RANDOM: 6.2 ug/mL

## 2015-02-17 LAB — HEMOGLOBIN A1C
Est. average glucose Bld gHb Est-mCnc: 143 mg/dL
HEMOGLOBIN A1C: 6.6 % — AB (ref 4.8–5.6)

## 2015-02-18 ENCOUNTER — Telehealth: Payer: Self-pay | Admitting: Adult Health

## 2015-02-18 NOTE — Telephone Encounter (Signed)
Mailbox full

## 2015-02-22 ENCOUNTER — Telehealth: Payer: Self-pay | Admitting: Adult Health

## 2015-02-22 NOTE — Telephone Encounter (Signed)
Mailbox full

## 2015-02-24 ENCOUNTER — Telehealth: Payer: Self-pay | Admitting: Adult Health

## 2015-02-24 MED ORDER — METFORMIN HCL ER (MOD) 500 MG PO TB24: 500.0000 mg | ORAL_TABLET | Freq: Every day | ORAL | Status: DC

## 2015-02-24 MED ORDER — LISINOPRIL 5 MG PO TABS: 5.0000 mg | ORAL_TABLET | Freq: Every day | ORAL | Status: DC

## 2015-02-24 MED ORDER — SIMVASTATIN 20 MG PO TABS: 20.0000 mg | ORAL_TABLET | Freq: Every day | ORAL | Status: DC

## 2015-02-24 NOTE — Telephone Encounter (Signed)
Left message to have Dana Lynch call me

## 2015-02-24 NOTE — Telephone Encounter (Signed)
Pt aware of labs, will change metformin to ER 1 daily since has not been taking due to GI upset and will add zocor and lisinopril and recheck  Labs in 3 months

## 2015-04-14 ENCOUNTER — Other Ambulatory Visit: Payer: Self-pay | Admitting: Adult Health

## 2015-07-10 ENCOUNTER — Other Ambulatory Visit: Payer: Self-pay | Admitting: Adult Health

## 2015-10-07 ENCOUNTER — Ambulatory Visit: Payer: BC Managed Care – PPO | Admitting: Obstetrics & Gynecology

## 2016-04-10 ENCOUNTER — Other Ambulatory Visit: Payer: Self-pay | Admitting: Obstetrics and Gynecology

## 2016-04-10 DIAGNOSIS — Z1231 Encounter for screening mammogram for malignant neoplasm of breast: Secondary | ICD-10-CM

## 2016-05-05 ENCOUNTER — Ambulatory Visit (HOSPITAL_COMMUNITY)
Admission: RE | Admit: 2016-05-05 | Discharge: 2016-05-05 | Disposition: A | Discharge status: Home / Self Care | Payer: BC Managed Care – PPO | Source: Ambulatory Visit | Attending: Obstetrics and Gynecology | Admitting: Obstetrics and Gynecology

## 2016-05-05 DIAGNOSIS — R928 Other abnormal and inconclusive findings on diagnostic imaging of breast: Secondary | ICD-10-CM | POA: Diagnosis not present

## 2016-05-05 DIAGNOSIS — Z1231 Encounter for screening mammogram for malignant neoplasm of breast: Secondary | ICD-10-CM

## 2016-05-08 ENCOUNTER — Other Ambulatory Visit: Payer: Self-pay | Admitting: Obstetrics and Gynecology

## 2016-05-08 DIAGNOSIS — R928 Other abnormal and inconclusive findings on diagnostic imaging of breast: Secondary | ICD-10-CM

## 2016-05-11 ENCOUNTER — Ambulatory Visit
Admission: RE | Admit: 2016-05-11 | Discharge: 2016-05-11 | Disposition: A | Discharge status: Home / Self Care | Payer: BC Managed Care – PPO | Source: Ambulatory Visit | Attending: Obstetrics and Gynecology | Admitting: Obstetrics and Gynecology

## 2016-05-11 DIAGNOSIS — R928 Other abnormal and inconclusive findings on diagnostic imaging of breast: Secondary | ICD-10-CM

## 2016-08-25 ENCOUNTER — Other Ambulatory Visit (HOSPITAL_COMMUNITY): Payer: Self-pay | Admitting: Pulmonary Disease

## 2016-08-25 DIAGNOSIS — R911 Solitary pulmonary nodule: Secondary | ICD-10-CM

## 2016-09-01 ENCOUNTER — Ambulatory Visit (HOSPITAL_COMMUNITY)
Admission: RE | Admit: 2016-09-01 | Discharge: 2016-09-01 | Disposition: A | Discharge status: Home / Self Care | Payer: BC Managed Care – PPO | Source: Ambulatory Visit | Attending: Pulmonary Disease | Admitting: Pulmonary Disease

## 2016-09-01 DIAGNOSIS — J984 Other disorders of lung: Secondary | ICD-10-CM | POA: Insufficient documentation

## 2016-09-01 DIAGNOSIS — R911 Solitary pulmonary nodule: Secondary | ICD-10-CM | POA: Diagnosis not present

## 2016-09-01 DIAGNOSIS — J479 Bronchiectasis, uncomplicated: Secondary | ICD-10-CM | POA: Diagnosis not present

## 2016-09-01 DIAGNOSIS — K760 Fatty (change of) liver, not elsewhere classified: Secondary | ICD-10-CM | POA: Diagnosis not present

## 2017-01-04 ENCOUNTER — Telehealth: Payer: Self-pay | Admitting: Adult Health

## 2017-01-04 ENCOUNTER — Other Ambulatory Visit: Payer: Self-pay

## 2017-01-04 DIAGNOSIS — N644 Mastodynia: Secondary | ICD-10-CM

## 2017-01-04 NOTE — Telephone Encounter (Signed)
Pt has breast pain to come at 1:15 for breast exam

## 2017-01-05 ENCOUNTER — Ambulatory Visit (INDEPENDENT_AMBULATORY_CARE_PROVIDER_SITE_OTHER): Payer: BC Managed Care – PPO | Admitting: Adult Health

## 2017-01-05 ENCOUNTER — Encounter: Payer: Self-pay | Admitting: Adult Health

## 2017-01-05 VITALS — BP 100/60 | HR 72 | Ht 67.0 in | Wt 220.0 lb

## 2017-01-05 DIAGNOSIS — E7800 Pure hypercholesterolemia, unspecified: Secondary | ICD-10-CM | POA: Diagnosis not present

## 2017-01-05 DIAGNOSIS — E119 Type 2 diabetes mellitus without complications: Secondary | ICD-10-CM

## 2017-01-05 DIAGNOSIS — N6320 Unspecified lump in the left breast, unspecified quadrant: Secondary | ICD-10-CM | POA: Diagnosis not present

## 2017-01-05 DIAGNOSIS — R5383 Other fatigue: Secondary | ICD-10-CM

## 2017-01-05 DIAGNOSIS — R634 Abnormal weight loss: Secondary | ICD-10-CM

## 2017-01-05 DIAGNOSIS — I10 Essential (primary) hypertension: Secondary | ICD-10-CM | POA: Diagnosis not present

## 2017-01-05 NOTE — Progress Notes (Signed)
Subjective:     Patient ID: Dana Lynch, female   DOB: April 10, 1971, 46 y.o.   MRN: [id number]  HPI Dana Lynch is a 46 year old white female, married in complaining of pain in left breast at times,she has physical scheduled for 01/26/17.Mammogram is not due til November.She complains of being tired and recent weight loss without trying. She says she has been bad and stopped BP meds, zocor and metformin.   Review of Systems Pain left breat +Fatigue +Weight loss Reviewed past medical,surgical, social and family history. Reviewed medications and allergies.     Objective:   Physical Exam BP 100/60 (BP Location: Left Arm, Patient Position: Sitting, Cuff Size: Small)   Pulse 72   Ht 5\' 7"  (1.702 m)   Wt 220 lb (99.8 kg)   BMI 34.46 kg/m   Skin warm and dry,  Breasts:no dominate palpable mass, retraction or nipple discharge on right, on left no retraction or nipple discharge but has 2 cm mass at 1 o'clock at nipple, it is tender. Will get diagnostic left mammogram and [place] and go ahead and get labs now.     Assessment:     1. Breast mass, left   2. Diabetes mellitus without complication ([place])   3. Essential hypertension   4. Fatigue, unspecified type   5. Weight loss   6. Elevated cholesterol       Plan:     Check CBC,CMP,TSH and lipids,A1c and vitamin D and microalbumin level(will get next week) Left diagnostic mammogram and [place] 7/10 at 3:20 pm at [facility] Return as scheduled for pap and physical 01/26/17 at 9:30 am

## 2017-01-16 ENCOUNTER — Inpatient Hospital Stay (HOSPITAL_COMMUNITY): Admission: RE | Admit: 2017-01-16 | Payer: BC Managed Care – PPO | Source: Ambulatory Visit

## 2017-01-17 ENCOUNTER — Ambulatory Visit
Admission: RE | Admit: 2017-01-17 | Discharge: 2017-01-17 | Disposition: A | Discharge status: Home / Self Care | Payer: BC Managed Care – PPO | Source: Ambulatory Visit | Attending: Adult Health | Admitting: Adult Health

## 2017-01-17 ENCOUNTER — Other Ambulatory Visit: Payer: Self-pay | Admitting: Adult Health

## 2017-01-17 DIAGNOSIS — N6320 Unspecified lump in the left breast, unspecified quadrant: Secondary | ICD-10-CM

## 2017-01-18 ENCOUNTER — Other Ambulatory Visit: Payer: Self-pay | Admitting: Adult Health

## 2017-01-18 ENCOUNTER — Telehealth: Payer: Self-pay | Admitting: Adult Health

## 2017-01-18 DIAGNOSIS — N6320 Unspecified lump in the left breast, unspecified quadrant: Secondary | ICD-10-CM

## 2017-01-18 NOTE — Telephone Encounter (Signed)
Called pt to offer support has biopsy tomorrow

## 2017-01-19 ENCOUNTER — Ambulatory Visit
Admission: RE | Admit: 2017-01-19 | Discharge: 2017-01-19 | Disposition: A | Discharge status: Home / Self Care | Payer: BC Managed Care – PPO | Source: Ambulatory Visit | Attending: Adult Health | Admitting: Adult Health

## 2017-01-19 DIAGNOSIS — N6320 Unspecified lump in the left breast, unspecified quadrant: Secondary | ICD-10-CM

## 2017-01-23 LAB — COMPREHENSIVE METABOLIC PANEL
A/G RATIO: 2.2 (ref 1.2–2.2)
ALBUMIN: 4.8 g/dL (ref 3.5–5.5)
ALK PHOS: 107 IU/L (ref 39–117)
ALT: 53 IU/L — ABNORMAL HIGH (ref 0–32)
AST: 22 IU/L (ref 0–40)
BILIRUBIN TOTAL: 0.5 mg/dL (ref 0.0–1.2)
BUN/Creatinine Ratio: 15 (ref 9–23)
BUN: 11 mg/dL (ref 6–24)
CHLORIDE: 99 mmol/L (ref 96–106)
CO2: 23 mmol/L (ref 20–29)
Calcium: 10.1 mg/dL (ref 8.7–10.2)
Creatinine, Ser: 0.72 mg/dL (ref 0.57–1.00)
GFR calc Af Amer: 115 mL/min/{1.73_m2} (ref >59)
GFR calc non Af Amer: 99 mL/min/{1.73_m2} (ref >59)
GLUCOSE: 451 mg/dL — AB (ref 65–99)
Globulin, Total: 2.2 g/dL (ref 1.5–4.5)
POTASSIUM: 5 mmol/L (ref 3.5–5.2)
Sodium: 139 mmol/L (ref 134–144)
Total Protein: 7 g/dL (ref 6.0–8.5)

## 2017-01-23 LAB — LIPID PANEL
Chol/HDL Ratio: 5.9 ratio — ABNORMAL HIGH (ref 0.0–4.4)
Cholesterol, Total: 194 mg/dL (ref 100–199)
HDL: 33 mg/dL — ABNORMAL LOW (ref >39)
LDL Calculated: 105 mg/dL — ABNORMAL HIGH (ref 0–99)
Triglycerides: 282 mg/dL — ABNORMAL HIGH (ref 0–149)
VLDL Cholesterol Cal: 56 mg/dL — ABNORMAL HIGH (ref 5–40)

## 2017-01-23 LAB — CBC
Hematocrit: 50.1 % — ABNORMAL HIGH (ref 34.0–46.6)
Hemoglobin: 16.6 g/dL — ABNORMAL HIGH (ref 11.1–15.9)
MCH: 29.9 pg (ref 26.6–33.0)
MCHC: 33.1 g/dL (ref 31.5–35.7)
MCV: 90 fL (ref 79–97)
Platelets: 232 10*3/uL (ref 150–379)
RBC: 5.56 x10E6/uL — ABNORMAL HIGH (ref 3.77–5.28)
RDW: 12.8 % (ref 12.3–15.4)
WBC: 7.5 10*3/uL (ref 3.4–10.8)

## 2017-01-23 LAB — HEMOGLOBIN A1C
Est. average glucose Bld gHb Est-mCnc: 341 mg/dL
Hgb A1c MFr Bld: 13.5 % — ABNORMAL HIGH (ref 4.8–5.6)

## 2017-01-23 LAB — VITAMIN D 25 HYDROXY (VIT D DEFICIENCY, FRACTURES): VIT D 25 HYDROXY: 20.2 ng/mL — AB (ref 30.0–100.0)

## 2017-01-23 LAB — MICROALBUMIN / CREATININE URINE RATIO
CREATININE, UR: 44.2 mg/dL
MICROALBUM., U, RANDOM: 3.2 ug/mL
Microalb/Creat Ratio: 7.2 mg/g creat (ref 0.0–30.0)

## 2017-01-23 LAB — TSH: TSH: 1.19 u[IU]/mL (ref 0.450–4.500)

## 2017-01-26 ENCOUNTER — Ambulatory Visit (INDEPENDENT_AMBULATORY_CARE_PROVIDER_SITE_OTHER): Payer: BC Managed Care – PPO | Admitting: Adult Health

## 2017-01-26 ENCOUNTER — Other Ambulatory Visit: Payer: BC Managed Care – PPO

## 2017-01-26 ENCOUNTER — Encounter: Payer: Self-pay | Admitting: Adult Health

## 2017-01-26 ENCOUNTER — Other Ambulatory Visit (HOSPITAL_COMMUNITY)
Admission: RE | Admit: 2017-01-26 | Discharge: 2017-01-26 | Disposition: A | Discharge status: Home / Self Care | Payer: BC Managed Care – PPO | Source: Ambulatory Visit | Attending: Adult Health | Admitting: Adult Health

## 2017-01-26 ENCOUNTER — Telehealth: Payer: Self-pay | Admitting: Adult Health

## 2017-01-26 VITALS — BP 100/60 | HR 88 | Ht 65.0 in | Wt 220.0 lb

## 2017-01-26 DIAGNOSIS — E559 Vitamin D deficiency, unspecified: Secondary | ICD-10-CM | POA: Diagnosis not present

## 2017-01-26 DIAGNOSIS — Z01419 Encounter for gynecological examination (general) (routine) without abnormal findings: Secondary | ICD-10-CM | POA: Diagnosis not present

## 2017-01-26 DIAGNOSIS — E119 Type 2 diabetes mellitus without complications: Secondary | ICD-10-CM | POA: Diagnosis not present

## 2017-01-26 DIAGNOSIS — Z01411 Encounter for gynecological examination (general) (routine) with abnormal findings: Secondary | ICD-10-CM | POA: Diagnosis not present

## 2017-01-26 DIAGNOSIS — Z1211 Encounter for screening for malignant neoplasm of colon: Secondary | ICD-10-CM | POA: Diagnosis not present

## 2017-01-26 DIAGNOSIS — Z1212 Encounter for screening for malignant neoplasm of rectum: Secondary | ICD-10-CM

## 2017-01-26 DIAGNOSIS — E7800 Pure hypercholesterolemia, unspecified: Secondary | ICD-10-CM | POA: Insufficient documentation

## 2017-01-26 DIAGNOSIS — I10 Essential (primary) hypertension: Secondary | ICD-10-CM | POA: Diagnosis not present

## 2017-01-26 LAB — HEMOCCULT GUIAC POC 1CARD (OFFICE): Fecal Occult Blood, POC: NEGATIVE

## 2017-01-26 MED ORDER — LISINOPRIL 5 MG PO TABS: 5.0000 mg | ORAL_TABLET | Freq: Every day | ORAL | 11 refills | Status: DC

## 2017-01-26 MED ORDER — CHOLECALCIFEROL 125 MCG (5000 UT) PO CAPS: 5000.0000 [IU] | ORAL_CAPSULE | Freq: Every day | ORAL | Status: DC

## 2017-01-26 MED ORDER — SIMVASTATIN 20 MG PO TABS: ORAL_TABLET | ORAL | 0 refills | Status: DC

## 2017-01-26 MED ORDER — METFORMIN HCL ER (MOD) 500 MG PO TB24: 500.0000 mg | ORAL_TABLET | Freq: Every day | ORAL | 11 refills | Status: DC

## 2017-01-26 NOTE — Telephone Encounter (Signed)
Pt aware of appt 7/23 at 10 am with Dr Dorris Fetch

## 2017-01-26 NOTE — Progress Notes (Signed)
Patient ID: Dana Lynch, female   DOB: Oct 01, 1970, 46 y.o.   MRN: [id number] History of Present Illness: Dana Lynch is a 46 year old white female, married, G3P3, in for well woman gyn exam and pap.She has not a physical since [id number] and had stopped taking her meds.She was seen 6/29 for breast mass and had biopsy that was been. She is tired and has lost weight with out trying.  PCP is Dana Dana Lynch.    Current Medications, Allergies, Past Medical History, Past Surgical History, Family History and Social History were reviewed in [employer] record.     Review of Systems: Patient denies any headaches, hearing loss,  blurred vision, shortness of breath, chest pain, abdominal pain, problems with bowel movements, urination, or intercourse. No joint pain or mood swings. Feels tired, and lost weight recently without trying.    Physical Exam:BP 100/60 (BP Location: Left Arm, Patient Position: Sitting, Cuff Size: Small)   Pulse 88   Ht 5\' 5"  (1.651 m)   Wt 220 lb (99.8 kg)   BMI 36.61 kg/m  General:  Well developed, well nourished, no acute distress Skin:  Warm and dry Neck:  Midline trachea, normal thyroid, good ROM, no lymphadenopathy Lungs; Clear to auscultation bilaterally Breast:  No dominant palpable mass, retraction, or nipple discharge,fading bruise left breast where had biopsy  Cardiovascular: Regular rate and rhythm Abdomen:  Soft, non tender, no hepatosplenomegaly Pelvic:  External genitalia is normal in appearance, no lesions.  The vagina is normal in appearance. Urethra has no lesions or masses. The cervix is bulbous.Pap with HPV performed.   Uterus is felt to be normal size, shape, and contour.  No adnexal masses or tenderness noted.Bladder is non tender, no masses felt. Rectal: Good sphincter tone, no polyps, or hemorrhoids felt.  Hemoccult negative. Extremities/musculoskeletal:  No swelling or varicosities noted, no clubbing or cyanosis Psych:  No mood  changes, alert and cooperative,seems happy PHQ 2 score 0. Reviewed labs with pt and gave her a copy.Pt aware that A1c is 13.5 and cholesterol elevated and vitamin D 20.2, will start back on meds and refer to Dana Lynch. Stressed to her importance of taking care of self.   Impression: 1. Encounter for gynecological examination with Papanicolaou smear of cervix   2. Screening for colorectal cancer   3. Essential hypertension   4. Elevated cholesterol   5. Diabetes mellitus without complication ([place])   6. Vitamin D deficiency       Plan: Meds ordered this encounter  Medications  . simvastatin (ZOCOR) 20 MG tablet    Sig: TAKE 1 TABLET(20 MG) BY MOUTH DAILY    Dispense:  30 tablet    Refill:  0    Order Specific Question:   Supervising Provider    Answer:   Dana Lynch, Dana Lynch [[id number]]  . metFORMIN (GLUMETZA) 500 MG (MOD) 24 hr tablet    Sig: Take 1 tablet (500 mg total) by mouth daily with breakfast.    Dispense:  30 tablet    Refill:  11    Order Specific Question:   Supervising Provider    Answer:   Dana Lynch, Dana Lynch [[id number]]  . lisinopril (PRINIVIL,ZESTRIL) 5 MG tablet    Sig: Take 1 tablet (5 mg total) by mouth daily.    Dispense:  30 tablet    Refill:  11    Order Specific Question:   Supervising Provider    Answer:   Dana Lynch, Dana Lynch [[id number]]  . Cholecalciferol 5000 units  capsule    Sig: Take 1 capsule (5,000 Units total) by mouth daily.    Order Specific Question:   Supervising Provider    Answer:   Dana Lynch [[id number]]   Physical in 1 year,pap in 3 if normal Mammogram in 3 months and then yearly Colonoscopy at 48 Referred to Dana Lynch, appt 7/23 at 10 am

## 2017-01-26 NOTE — Addendum Note (Signed)
Addended by: Diona Fanti A on: 01/26/2017 12:07 PM   Modules accepted: Orders

## 2017-01-29 ENCOUNTER — Ambulatory Visit: Payer: BC Managed Care – PPO | Admitting: "Endocrinology

## 2017-01-30 LAB — CYTOLOGY - PAP
DIAGNOSIS: NEGATIVE
HPV: NOT DETECTED

## 2017-02-09 ENCOUNTER — Encounter: Payer: Self-pay | Admitting: "Endocrinology

## 2017-02-09 ENCOUNTER — Ambulatory Visit (INDEPENDENT_AMBULATORY_CARE_PROVIDER_SITE_OTHER): Payer: BC Managed Care – PPO | Admitting: "Endocrinology

## 2017-02-09 VITALS — BP 111/78 | HR 80 | Ht 65.0 in | Wt 218.0 lb

## 2017-02-09 DIAGNOSIS — E1165 Type 2 diabetes mellitus with hyperglycemia: Secondary | ICD-10-CM

## 2017-02-09 DIAGNOSIS — E782 Mixed hyperlipidemia: Secondary | ICD-10-CM | POA: Insufficient documentation

## 2017-02-09 DIAGNOSIS — Z6836 Body mass index (BMI) 36.0-36.9, adult: Secondary | ICD-10-CM | POA: Diagnosis not present

## 2017-02-09 DIAGNOSIS — IMO0001 Reserved for inherently not codable concepts without codable children: Secondary | ICD-10-CM

## 2017-02-09 DIAGNOSIS — I10 Essential (primary) hypertension: Secondary | ICD-10-CM | POA: Diagnosis not present

## 2017-02-09 DIAGNOSIS — E66812 Obesity, class 2: Secondary | ICD-10-CM

## 2017-02-09 MED ORDER — EMPAGLIFLOZIN-METFORMIN HCL 12.5-500 MG PO TABS: 1.0000 | ORAL_TABLET | Freq: Two times a day (BID) | ORAL | 3 refills | Status: DC

## 2017-02-09 MED ORDER — BLOOD GLUCOSE MONITOR KIT
PACK | 0 refills | Status: DC
Start: 2017-02-09 — End: 2019-03-13

## 2017-02-09 NOTE — Progress Notes (Signed)
Subjective:    Patient ID: Dana Lynch, female    DOB: 1971-01-14. Patient is being seen in consultation for management of diabetes requested by  Sinda Du, MD  Past Medical History:  Diagnosis Date  . Bowel habit changes 12/22/2013   Alternates constipation and diarrhea. Will refer to GI   . Diabetes ([place]) 02/16/2014  . Fibroids 12/22/2013  . Hypertension   . Unspecified symptom associated with female genital organs 12/22/2013   Past Surgical History:  Procedure Laterality Date  . APPENDECTOMY    . BREAST BIOPSY    . ENDOMETRIAL ABLATION    . TONSILLECTOMY AND ADENOIDECTOMY     Social History   Social History  . Marital status: Married    Spouse name: N/A  . Number of children: N/A  . Years of education: N/A   Social History Main Topics  . Smoking status: Never Smoker  . Smokeless tobacco: Never Used  . Alcohol use Yes     Comment: occ  . Drug use: No  . Sexual activity: Yes    Birth control/ protection: Surgical     Comment: ablation   Other Topics Concern  . None   Social History Narrative  . None   Outpatient Encounter Prescriptions as of 02/09/2017  Medication Sig  . blood glucose meter kit and supplies KIT Dispense based on patient and insurance preference. Use up to four times daily as directed. (FOR ICD-10 E11.65)  . Cholecalciferol 5000 units capsule Take 1 capsule (5,000 Units total) by mouth daily. (Patient not taking: Reported on 02/09/2017)  . Empagliflozin-Metformin HCl (SYNJARDY) 12.5-500 MG TABS Take 1 tablet by mouth 2 (two) times daily after a meal.  . lisinopril (PRINIVIL,ZESTRIL) 5 MG tablet Take 1 tablet (5 mg total) by mouth daily.  . simvastatin (ZOCOR) 20 MG tablet TAKE 1 TABLET(20 MG) BY MOUTH DAILY  . [DISCONTINUED] metFORMIN (GLUMETZA) 500 MG (MOD) 24 hr tablet Take 1 tablet (500 mg total) by mouth daily with breakfast.   No facility-administered encounter medications on file as of 02/09/2017.    ALLERGIES: No Known  Allergies VACCINATION STATUS:  There is no immunization history on file for this patient.  Diabetes  She presents for her initial diabetic visit. She has type 2 diabetes mellitus. Onset time: She was diagnosed at approximate age of 23 years. Her disease course has been worsening. There are no hypoglycemic associated symptoms. Pertinent negatives for hypoglycemia include no confusion, headaches, pallor or seizures. Associated symptoms include blurred vision, fatigue, polyphagia and polyuria. Pertinent negatives for diabetes include no chest pain and no polydipsia. There are no hypoglycemic complications. Symptoms are worsening. There are no diabetic complications. Risk factors for coronary artery disease include diabetes mellitus, dyslipidemia, family history, obesity, hypertension and sedentary lifestyle. Current diabetic treatment includes oral agent (monotherapy) (She is on metformin 500 mg by mouth daily.). Her weight is decreasing steadily (She lost approximately 20 pounds during symptomatic several weeks before diagnoses including polydipsia ,polyuria, weight loss, and fatigue.). She is following a generally unhealthy diet. When asked about meal planning, she reported none. She has not had a previous visit with a dietitian. She rarely participates in exercise. Home blood sugar record trend: Her A1c was 13.5% on 01/19/2017 during time of diagnosis. (She did not bring any meter nor logs to review today. ) An ACE inhibitor/angiotensin II receptor blocker is being taken.  Hyperlipidemia  This is a chronic problem. The current episode started more than 1 year ago. The problem is  uncontrolled. Exacerbating diseases include diabetes and obesity. Pertinent negatives include no chest pain, myalgias or shortness of breath. Current antihyperlipidemic treatment includes statins. Risk factors for coronary artery disease include dyslipidemia, family history, diabetes mellitus, obesity, hypertension and a sedentary  lifestyle.  Hypertension  This is a chronic problem. The current episode started more than 1 year ago. The problem is controlled. Associated symptoms include blurred vision. Pertinent negatives include no chest pain, headaches, palpitations or shortness of breath. Risk factors for coronary artery disease include diabetes mellitus, dyslipidemia, family history, obesity and sedentary lifestyle. Past treatments include ACE inhibitors.       Review of Systems  Constitutional: Positive for fatigue. Negative for chills, fever and unexpected weight change.  HENT: Negative for trouble swallowing and voice change.   Eyes: Positive for blurred vision. Negative for visual disturbance.  Respiratory: Negative for cough, shortness of breath and wheezing.   Cardiovascular: Negative for chest pain, palpitations and leg swelling.  Gastrointestinal: Negative for diarrhea, nausea and vomiting.  Endocrine: Positive for polyphagia and polyuria. Negative for cold intolerance, heat intolerance and polydipsia.  Musculoskeletal: Negative for arthralgias and myalgias.  Skin: Negative for color change, pallor, rash and wound.  Neurological: Negative for seizures and headaches.  Psychiatric/Behavioral: Negative for confusion and suicidal ideas.    Objective:    BP 111/78   Pulse 80   Ht '5\' 5"'$  (1.651 m)   Wt 218 lb (98.9 kg)   BMI 36.28 kg/m   Wt Readings from Last 3 Encounters:  02/09/17 218 lb (98.9 kg)  01/26/17 220 lb (99.8 kg)  01/05/17 220 lb (99.8 kg)    Physical Exam  Constitutional: She is oriented to person, place, and time. She appears well-developed.  HENT:  Head: Normocephalic and atraumatic.  Eyes: EOM are normal.  Neck: Normal range of motion. Neck supple. No tracheal deviation present. No thyromegaly present.  Cardiovascular: Normal rate and regular rhythm.   Pulmonary/Chest: Effort normal and breath sounds normal.  Abdominal: Soft. Bowel sounds are normal. There is no tenderness. There  is no guarding.  Musculoskeletal: Normal range of motion. She exhibits no edema.  Neurological: She is alert and oriented to person, place, and time. She has normal reflexes. No cranial nerve deficit. Coordination normal.  Skin: Skin is warm and dry. No rash noted. No erythema. No pallor.  Psychiatric: She has a normal mood and affect. Judgment normal.     CMP ( most recent) CMP     Component Value Date/Time   NA 139 01/19/2017 0955   K 5.0 01/19/2017 0955   CL 99 01/19/2017 0955   CO2 23 01/19/2017 0955   GLUCOSE 451 (H) 01/19/2017 0955   GLUCOSE 134 (H) 04/20/2014 0915   BUN 11 01/19/2017 0955   CREATININE 0.72 01/19/2017 0955   CREATININE 0.69 04/20/2014 0915   CALCIUM 10.1 01/19/2017 0955   PROT 7.0 01/19/2017 0955   ALBUMIN 4.8 01/19/2017 0955   AST 22 01/19/2017 0955   ALT 53 (H) 01/19/2017 0955   ALKPHOS 107 01/19/2017 0955   BILITOT 0.5 01/19/2017 0955   GFRNONAA 99 01/19/2017 0955   GFRAA 115 01/19/2017 0955     Diabetic Labs (most recent): Lab Results  Component Value Date   HGBA1C 13.5 (H) 01/19/2017   HGBA1C 6.6 (H) 02/16/2015   HGBA1C 6.7 (H) 04/20/2014     Lipid Panel ( most recent) Lipid Panel     Component Value Date/Time   CHOL 194 01/19/2017 0955   TRIG 282 (H) 01/19/2017  0955   HDL 33 (L) 01/19/2017 0955   CHOLHDL 5.9 (H) 01/19/2017 0955   CHOLHDL 4.7 04/20/2014 0915   VLDL 14 04/20/2014 0915   LDLCALC 105 (H) 01/19/2017 0955     Assessment & Plan:   1. Uncontrolled type 2 diabetes mellitus without complication, without long-term current use of insulin ([place])  - Patient has currently uncontrolled symptomatic type 2 DM since  46 years of age,  with most recent A1c of 13.5 %. Recent labs reviewed.   She does not report gross complications from diabetes, however she remains at a high risk for more acute and chronic complications which include CAD, CVA, CKD, retinopathy, and neuropathy. These are all discussed in detail with the patient.  -  I have counseled the patient on diet management and weight loss, by adopting a carbohydrate restricted/protein rich diet.  - Suggestion is made for patient to avoid simple carbohydrates   from her diet including Cakes , Desserts, Ice Cream,  Soda (  diet and regular) , Sweet Tea , Candies,  Chips, Cookies, Artificial Sweeteners,   and "Sugar-free" Products . This will help patient to have stable blood glucose profile and potentially avoid unintended weight gain.  - I encouraged the patient to switch to  unprocessed or minimally processed complex starch and increased protein intake (animal or plant source), fruits, and vegetables.  - Patient is advised to stick to a routine mealtimes to eat 3 meals  a day and avoid unnecessary snacks ( to snack only to correct hypoglycemia).  - The patient will be scheduled with Jearld Fenton, RDN, CDE for individualized DM education.  - I have approached patient with the following individualized plan to manage diabetes and patient agrees:   - Based on her commitment, she would not require insulin treatment for now. She has a chance to keep medications to the minimum. - To give her a better and faster control of diabetes to target, I will modify her metformin to Synjardy 12.5/500 mg by mouth twice a day. Side effects and precautions discussed with her. - I will discontinue metformin for now.  - Patient will be considered for incretin therapy as appropriate next visit if she does not achieve control. - Patient specific target  A1c;  LDL, HDL, Triglycerides, and  Waist Circumference were discussed in detail.  2) BP/HTN:  Controlled. Continue current medications including ACEI/ARB. 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   Patient is advised to continue statins. better control of diabetes will help with triglycerides as well as. 4)  Weight/Diet: CDE Consult will be initiated , exercise, and detailed carbohydrates information provided.  5)  Chronic Care/Health Maintenance:  -Patient is on ACEI/ARB and Statin medications and encouraged to continue to follow up with Ophthalmology, Podiatrist at least yearly or according to recommendations, and advised to  stay away from smoking. I have recommended yearly flu vaccine and pneumonia vaccination at least every 5 years; moderate intensity exercise for up to 150 minutes weekly; and  sleep for at least 7 hours a day.  - 60 minutes of time was spent on the care of this patient , 50% of which was applied for counseling on diabetes complications and their preventions. - I advised patient to maintain close follow up with Sinda Du, MD for primary care needs.  Follow up plan: - Return in about 10 weeks (around 04/20/2017) for follow up with pre-visit labs.  Glade Lloyd, MD Phone: [phone]  Fax: [phone]   02/09/2017, 10:57  AM

## 2017-02-09 NOTE — Patient Instructions (Signed)

## 2017-03-05 ENCOUNTER — Other Ambulatory Visit: Payer: Self-pay | Admitting: Adult Health

## 2017-03-26 ENCOUNTER — Encounter: Payer: Self-pay | Admitting: Nutrition

## 2017-03-26 ENCOUNTER — Encounter: Payer: BC Managed Care – PPO | Attending: Pulmonary Disease | Admitting: Nutrition

## 2017-03-26 VITALS — Ht 65.0 in | Wt 221.0 lb

## 2017-03-26 DIAGNOSIS — E1165 Type 2 diabetes mellitus with hyperglycemia: Secondary | ICD-10-CM

## 2017-03-26 DIAGNOSIS — IMO0002 Reserved for concepts with insufficient information to code with codable children: Secondary | ICD-10-CM

## 2017-03-26 DIAGNOSIS — Z713 Dietary counseling and surveillance: Secondary | ICD-10-CM | POA: Insufficient documentation

## 2017-03-26 DIAGNOSIS — E669 Obesity, unspecified: Secondary | ICD-10-CM

## 2017-03-26 DIAGNOSIS — Z6836 Body mass index (BMI) 36.0-36.9, adult: Secondary | ICD-10-CM | POA: Insufficient documentation

## 2017-03-26 DIAGNOSIS — E118 Type 2 diabetes mellitus with unspecified complications: Secondary | ICD-10-CM

## 2017-03-26 DIAGNOSIS — E119 Type 2 diabetes mellitus without complications: Secondary | ICD-10-CM | POA: Diagnosis present

## 2017-03-26 NOTE — Progress Notes (Signed)
Diabetes Self-Management Education  Visit Type: First/Initial  Appt. Start Time: 1530 Appt. End Time: 1630  03/26/2017  [name], identified by name and date of birth, is a 46 y.o. female with a diagnosis of Diabetes: Type 2.  She lives with her husband and three kids. Is a Education officer, museum. Just got diagnosed in the last month or two. BS are now 130-150's in am. History of GDM. Synjardy daily. Tolerating well.   PMH: Hyperlipidemia. On Statin. Overweight-wants to lose weight. Has changed diet to eating more high fiber foods, less processed foods and trying to eat 3 meals per day. Use to skip breakfast daily. Now eating oatmeal and eating more salads and grilled meats. Cutting out snacks. Still drinking diet soda/green tea at times. Motivated to make changes with eating habits and exercise. Feels much better since BS are better.   Lab Results  Component Value Date   HGBA1C 13.5 (H) 01/19/2017   Lipid Panel     Component Value Date/Time   CHOL 194 01/19/2017 0955   TRIG 282 (H) 01/19/2017 0955   HDL 33 (L) 01/19/2017 0955   CHOLHDL 5.9 (H) 01/19/2017 0955   CHOLHDL 4.7 04/20/2014 0915   VLDL 14 04/20/2014 0915   LDLCALC 105 (H) 01/19/2017 0955   CMP Latest Ref Rng & Units 01/19/2017 02/16/2015 04/20/2014  Glucose 65 - 99 mg/dL 451(H) 136(H) 134(H)  BUN 6 - 24 mg/dL 11 10 9   Creatinine 0.57 - 1.00 mg/dL 0.72 0.66 0.69  Sodium 134 - 144 mmol/L 139 142 140  Potassium 3.5 - 5.2 mmol/L 5.0 4.3 4.4  Chloride 96 - 106 mmol/L 99 99 107  CO2 20 - 29 mmol/L 23 22 24   Calcium 8.7 - 10.2 mg/dL 10.1 9.7 8.9  Total Protein 6.0 - 8.5 g/dL 7.0 6.8 6.3  Total Bilirubin 0.0 - 1.2 mg/dL 0.5 0.4 0.4  Alkaline Phos 39 - 117 IU/L 107 60 52  AST 0 - 40 IU/L 22 26 23   ALT 0 - 32 IU/L 53(H) 43(H) 40(H)      ASSESSMENT  Height 5\' 5"  (1.651 m), weight 221 lb (100.2 kg). Body mass index is 36.78 kg/m.      Diabetes Self-Management Education - 03/26/17 1523      Visit Information    Visit Type First/Initial     Initial Visit   Diabetes Type Type 2   Are you currently following a meal plan? No   Are you taking your medications as prescribed? Yes   Date Diagnosed 2018     Health Coping   How would you rate your overall health? Good     Psychosocial Assessment   Patient Belief/Attitude about Diabetes Motivated to manage diabetes   Self-care barriers None   Self-management support Doctor's office   Other persons present Patient;Family Member   Patient Concerns Nutrition/Meal planning;Medication;Monitoring;Healthy Lifestyle;Glycemic Control   Special Needs None   Preferred Learning Style No preference indicated   Learning Readiness Change in progress   How often do you need to have someone help you when you read instructions, pamphlets, or other written materials from your doctor or pharmacy? 1 - Never   What is the last grade level you completed in school? 18     Pre-Education Assessment   Patient understands the diabetes disease and treatment process. Needs Instruction   Patient understands incorporating nutritional management into lifestyle. Needs Instruction   Patient undertands incorporating physical activity into lifestyle. Needs Instruction   Patient understands using medications safely.  Needs Instruction   Patient understands monitoring blood glucose, interpreting and using results Needs Instruction   Patient understands prevention, detection, and treatment of acute complications. Needs Instruction   Patient understands prevention, detection, and treatment of chronic complications. Needs Instruction   Patient understands how to develop strategies to address psychosocial issues. Needs Instruction   Patient understands how to develop strategies to promote health/change behavior. Needs Instruction     Complications   Last HgB A1C per patient/outside source 13.8 %   How often do you check your blood sugar? 1-2 times/day   Fasting Blood glucose range  (mg/dL) 70-129   Number of hypoglycemic episodes per month 0   Number of hyperglycemic episodes per week 0   Have you had a dilated eye exam in the past 12 months? Yes   Have you had a dental exam in the past 12 months? Yes   Are you checking your feet? Yes   How many days per week are you checking your feet? 7     Dietary Intake   Breakfast Oatmeal, water   Lunch salad, water   Snack (afternoon) fruit   Dinner Spaghetti,1 cup, bread 1 slice, toss salad with O/V,  Diet brisk green tea   Beverage(s) water, unsweet tea     Exercise   Exercise Type ADL's      Individualized Plan for Diabetes Self-Management Training:   Learning Objective:  Patient will have a greater understanding of diabetes self-management. Patient education plan is to attend individual and/or group sessions per assessed needs and concerns.   Plan:   Patient Instructions  Goals 1. Follow My Plate Method Eat 2-3 carb choices per meal 2. Walk 30 minutes  Day 5 times per week 3. Eat breakfast daily. 4. Get A1C down to 6%. 5.  Cut out diet sodas and tea    Expected Outcomes:    Improved self management of her DM. Prevention of complications.  Education material provided: Living Well with Diabetes, A1C conversion sheet, Meal plan card, My Plate and Carbohydrate counting sheet  If problems or questions, patient to contact team via:  Phone and Email  Future DSME appointment:   1 month

## 2017-03-26 NOTE — Patient Instructions (Signed)
Goals 1. Follow My Plate Method Eat 2-3 carb choices per meal 2. Walk 30 minutes  Day 5 times per week 3. Eat breakfast daily. 4. Get A1C down to 6%. 5.  Cut out diet sodas and tea

## 2017-04-19 ENCOUNTER — Ambulatory Visit: Payer: BC Managed Care – PPO | Admitting: "Endocrinology

## 2017-04-23 ENCOUNTER — Other Ambulatory Visit: Payer: Self-pay | Admitting: Adult Health

## 2017-04-23 DIAGNOSIS — N63 Unspecified lump in unspecified breast: Secondary | ICD-10-CM

## 2017-05-01 ENCOUNTER — Ambulatory Visit: Payer: BC Managed Care – PPO | Admitting: Nutrition

## 2017-05-14 ENCOUNTER — Other Ambulatory Visit: Payer: BC Managed Care – PPO

## 2017-05-21 ENCOUNTER — Ambulatory Visit
Admission: RE | Admit: 2017-05-21 | Discharge: 2017-05-21 | Disposition: A | Discharge status: Home / Self Care | Payer: BC Managed Care – PPO | Source: Ambulatory Visit | Attending: Adult Health | Admitting: Adult Health

## 2017-05-21 ENCOUNTER — Other Ambulatory Visit: Payer: Self-pay | Admitting: "Endocrinology

## 2017-05-21 ENCOUNTER — Other Ambulatory Visit: Payer: Self-pay | Admitting: Adult Health

## 2017-05-21 DIAGNOSIS — N63 Unspecified lump in unspecified breast: Secondary | ICD-10-CM

## 2017-05-22 ENCOUNTER — Ambulatory Visit: Payer: BC Managed Care – PPO | Admitting: "Endocrinology

## 2017-05-22 LAB — RENAL FUNCTION PANEL
Albumin: 5 g/dL (ref 3.5–5.5)
BUN/Creatinine Ratio: 21 (ref 9–23)
BUN: 15 mg/dL (ref 6–24)
CO2: 20 mmol/L (ref 20–29)
Calcium: 10.1 mg/dL (ref 8.7–10.2)
Chloride: 100 mmol/L (ref 96–106)
Creatinine, Ser: 0.71 mg/dL (ref 0.57–1.00)
GFR calc Af Amer: 116 mL/min/{1.73_m2} (ref >59)
GFR, EST NON AFRICAN AMERICAN: 101 mL/min/{1.73_m2} (ref >59)
GLUCOSE: 106 mg/dL — AB (ref 65–99)
PHOSPHORUS: 3.9 mg/dL (ref 2.5–4.5)
POTASSIUM: 4.8 mmol/L (ref 3.5–5.2)
SODIUM: 139 mmol/L (ref 134–144)

## 2017-05-22 LAB — HGB A1C W/O EAG: Hgb A1c MFr Bld: 6.7 % — ABNORMAL HIGH (ref 4.8–5.6)

## 2017-06-18 ENCOUNTER — Ambulatory Visit: Payer: BC Managed Care – PPO | Admitting: "Endocrinology

## 2017-06-20 ENCOUNTER — Encounter: Payer: Self-pay | Admitting: "Endocrinology

## 2017-06-20 ENCOUNTER — Ambulatory Visit (INDEPENDENT_AMBULATORY_CARE_PROVIDER_SITE_OTHER): Payer: BC Managed Care – PPO | Admitting: "Endocrinology

## 2017-06-20 VITALS — BP 122/84 | HR 78 | Ht 65.0 in | Wt 217.0 lb

## 2017-06-20 DIAGNOSIS — E782 Mixed hyperlipidemia: Secondary | ICD-10-CM

## 2017-06-20 DIAGNOSIS — I10 Essential (primary) hypertension: Secondary | ICD-10-CM

## 2017-06-20 DIAGNOSIS — E1165 Type 2 diabetes mellitus with hyperglycemia: Secondary | ICD-10-CM | POA: Diagnosis not present

## 2017-06-20 DIAGNOSIS — IMO0001 Reserved for inherently not codable concepts without codable children: Secondary | ICD-10-CM

## 2017-06-20 NOTE — Progress Notes (Signed)
Subjective:    Patient ID: Dana Lynch, female    DOB: January 09, 1971. Patient is being seen in follow up for management of diabetes requested by  Sinda Du, MD  Past Medical History:  Diagnosis Date  . Bowel habit changes 12/22/2013   Alternates constipation and diarrhea. Will refer to GI   . Diabetes ([place]) 02/16/2014  . Fibroids 12/22/2013  . Hypertension   . Unspecified symptom associated with female genital organs 12/22/2013   Past Surgical History:  Procedure Laterality Date  . APPENDECTOMY    . BREAST BIOPSY    . ENDOMETRIAL ABLATION    . TONSILLECTOMY AND ADENOIDECTOMY     Social History   Socioeconomic History  . Marital status: Married    Spouse name: None  . Number of children: None  . Years of education: None  . Highest education level: None  Social Needs  . Financial resource strain: None  . Food insecurity - worry: None  . Food insecurity - inability: None  . Transportation needs - medical: None  . Transportation needs - non-medical: None  Occupational History  . None  Tobacco Use  . Smoking status: Never Smoker  . Smokeless tobacco: Never Used  Substance and Sexual Activity  . Alcohol use: Yes    Comment: occ  . Drug use: No  . Sexual activity: Yes    Birth control/protection: Surgical    Comment: ablation  Other Topics Concern  . None  Social History Narrative  . None   Outpatient Encounter Medications as of 06/20/2017  Medication Sig  . blood glucose meter kit and supplies KIT Dispense based on patient and insurance preference. Use up to four times daily as directed. (FOR ICD-10 E11.65)  . Cholecalciferol 5000 units capsule Take 1 capsule (5,000 Units total) by mouth daily.  . Empagliflozin-Metformin HCl (SYNJARDY) 12.5-500 MG TABS Take 1 tablet by mouth 2 (two) times daily after a meal.  . lisinopril (PRINIVIL,ZESTRIL) 5 MG tablet Take 1 tablet (5 mg total) by mouth daily.  . simvastatin (ZOCOR) 20 MG tablet TAKE 1 TABLET(20 MG) BY  MOUTH DAILY   No facility-administered encounter medications on file as of 06/20/2017.    ALLERGIES: No Known Allergies VACCINATION STATUS:  There is no immunization history on file for this patient.  Diabetes  She presents for her follow-up diabetic visit. She has type 2 diabetes mellitus. Onset time: She was diagnosed at approximate age of 20 years. Her disease course has been improving. There are no hypoglycemic associated symptoms. Pertinent negatives for hypoglycemia include no confusion, headaches, pallor or seizures. Pertinent negatives for diabetes include no blurred vision, no chest pain, no fatigue, no polydipsia, no polyphagia and no polyuria. There are no hypoglycemic complications. Symptoms are improving. There are no diabetic complications. Risk factors for coronary artery disease include diabetes mellitus, dyslipidemia, family history, obesity, hypertension and sedentary lifestyle. Current diabetic treatment includes oral agent (monotherapy) (She is on metformin 500 mg by mouth daily.). Her weight is decreasing steadily. She is following a generally unhealthy diet. When asked about meal planning, she reported none. She has not had a previous visit with a dietitian. She rarely participates in exercise. An ACE inhibitor/angiotensin II receptor blocker is being taken.  Hyperlipidemia  This is a chronic problem. The current episode started more than 1 year ago. The problem is uncontrolled. Exacerbating diseases include diabetes and obesity. Pertinent negatives include no chest pain, myalgias or shortness of breath. Current antihyperlipidemic treatment includes statins. Risk factors for  coronary artery disease include dyslipidemia, family history, diabetes mellitus, obesity, hypertension and a sedentary lifestyle.  Hypertension  This is a chronic problem. The current episode started more than 1 year ago. The problem is controlled. Pertinent negatives include no blurred vision, chest pain,  headaches, palpitations or shortness of breath. Risk factors for coronary artery disease include diabetes mellitus, dyslipidemia, family history, obesity and sedentary lifestyle. Past treatments include ACE inhibitors.    Review of Systems  Constitutional: Negative for chills, fatigue, fever and unexpected weight change.  HENT: Negative for trouble swallowing and voice change.   Eyes: Negative for blurred vision and visual disturbance.  Respiratory: Negative for cough, shortness of breath and wheezing.   Cardiovascular: Negative for chest pain, palpitations and leg swelling.  Gastrointestinal: Negative for diarrhea, nausea and vomiting.  Endocrine: Negative for cold intolerance, heat intolerance, polydipsia, polyphagia and polyuria.  Musculoskeletal: Negative for arthralgias and myalgias.  Skin: Negative for color change, pallor, rash and wound.  Neurological: Negative for seizures and headaches.  Psychiatric/Behavioral: Negative for confusion and suicidal ideas.    Objective:    BP 122/84   Pulse 78   Ht 5' 5"  (1.651 m)   Wt 217 lb (98.4 kg)   BMI 36.11 kg/m   Wt Readings from Last 3 Encounters:  06/20/17 217 lb (98.4 kg)  03/26/17 221 lb (100.2 kg)  02/09/17 218 lb (98.9 kg)    Physical Exam  Constitutional: She is oriented to person, place, and time. She appears well-developed.  HENT:  Head: Normocephalic and atraumatic.  Eyes: EOM are normal.  Neck: Normal range of motion. Neck supple. No tracheal deviation present. No thyromegaly present.  Cardiovascular: Normal rate and regular rhythm.  Pulmonary/Chest: Effort normal and breath sounds normal.  Abdominal: Soft. Bowel sounds are normal. There is no tenderness. There is no guarding.  Musculoskeletal: Normal range of motion. She exhibits no edema.  Neurological: She is alert and oriented to person, place, and time. She has normal reflexes. No cranial nerve deficit. Coordination normal.  Skin: Skin is warm and dry. No rash  noted. No erythema. No pallor.  Psychiatric: She has a normal mood and affect. Judgment normal.     CMP ( most recent) CMP     Component Value Date/Time   NA 139 05/21/2017 1208   K 4.8 05/21/2017 1208   CL 100 05/21/2017 1208   CO2 20 05/21/2017 1208   GLUCOSE 106 (H) 05/21/2017 1208   GLUCOSE 134 (H) 04/20/2014 0915   BUN 15 05/21/2017 1208   CREATININE 0.71 05/21/2017 1208   CREATININE 0.69 04/20/2014 0915   CALCIUM 10.1 05/21/2017 1208   PROT 7.0 01/19/2017 0955   ALBUMIN 5.0 05/21/2017 1208   AST 22 01/19/2017 0955   ALT 53 (H) 01/19/2017 0955   ALKPHOS 107 01/19/2017 0955   BILITOT 0.5 01/19/2017 0955   GFRNONAA 101 05/21/2017 1208   GFRAA 116 05/21/2017 1208     Diabetic Labs (most recent): Lab Results  Component Value Date   HGBA1C 6.7 (H) 05/21/2017   HGBA1C 13.5 (H) 01/19/2017   HGBA1C 6.6 (H) 02/16/2015     Lipid Panel ( most recent) Lipid Panel     Component Value Date/Time   CHOL 194 01/19/2017 0955   TRIG 282 (H) 01/19/2017 0955   HDL 33 (L) 01/19/2017 0955   CHOLHDL 5.9 (H) 01/19/2017 0955   CHOLHDL 4.7 04/20/2014 0915   VLDL 14 04/20/2014 0915   LDLCALC 105 (H) 01/19/2017 0955     Assessment &  Plan:   1. Uncontrolled type 2 diabetes mellitus without complication, without long-term current use of insulin ([place])  - Patient has currently uncontrolled symptomatic type 2 DM since  46 years of age. - She came with significantly improved profile with A1c of 6.7% improving from 13.5%. - Recent labs reviewed.   She does not report gross complications from diabetes, however she remains at a high risk for more acute and chronic complications which include CAD, CVA, CKD, retinopathy, and neuropathy. These are all discussed in detail with the patient.  - I have counseled the patient on diet management and weight loss, by adopting a carbohydrate restricted/protein rich diet.  -  Suggestion is made for her to avoid simple carbohydrates  from her diet  including Cakes, Sweet Desserts / Pastries, Ice Cream, Soda (diet and regular), Sweet Tea, Candies, Chips, Cookies, Store Bought Juices, Alcohol in Excess of  1-2 drinks a day, Artificial Sweeteners, and "Sugar-free" Products. This will help patient to have stable blood glucose profile and potentially avoid unintended weight gain.  - I encouraged the patient to switch to  unprocessed or minimally processed complex starch and increased protein intake (animal or plant source), fruits, and vegetables.  - Patient is advised to stick to a routine mealtimes to eat 3 meals  a day and avoid unnecessary snacks ( to snack only to correct hypoglycemia).   - I have approached patient with the following individualized plan to manage diabetes and patient agrees:   - Based on her presentation with improved A1c of 6.7%, she would not require additional treatment for diabetes at this time. - I will continue Synjardy 12.5/500 mg by mouth twice a day. Side effects and precautions discussed with her.  - Patient will be considered for incretin therapy as appropriate next visit if she does not achieve control. - Patient specific target  A1c;  LDL, HDL, Triglycerides, and  Waist Circumference were discussed in detail.  2) BP/HTN:  Controlled. Continue current medications including ACEI/ARB. 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   Patient is advised to continue statins. Better control of diabetes will help with triglycerides as well, will have fasting lipid panel before her next visit.  4)  Weight/Diet: CDE Consult has been requested , exercise, and detailed carbohydrates information provided.  5) Chronic Care/Health Maintenance:  -Patient is on ACEI/ARB and Statin medications and encouraged to continue to follow up with Ophthalmology, Podiatrist at least yearly or according to recommendations, and advised to  stay away from smoking. I have recommended yearly flu vaccine and pneumonia  vaccination at least every 5 years; moderate intensity exercise for up to 150 minutes weekly; and  sleep for at least 7 hours a day.  - I advised patient to maintain close follow up with Sinda Du, MD for primary care needs. - Time spent with the patient: 25 min, of which >50% was spent in reviewing her sugar logs , discussing her hypo- and hyper-glycemic episodes, reviewing her current and  previous labs and insulin doses and developing a plan to avoid hypo- and hyper-glycemia.   Follow up plan: - Return in about 3 months (around 09/18/2017) for follow up with pre-visit labs.  Glade Lloyd, MD Phone: [phone]  Fax: [phone]  -  This note was partially dictated with voice recognition software. Similar sounding words can be transcribed inadequately or may not  be corrected upon review.  06/20/2017, 3:45 PM

## 2017-06-20 NOTE — Patient Instructions (Signed)

## 2017-08-02 ENCOUNTER — Other Ambulatory Visit: Payer: Self-pay | Admitting: "Endocrinology

## 2017-09-10 ENCOUNTER — Other Ambulatory Visit: Payer: Self-pay | Admitting: "Endocrinology

## 2017-09-18 ENCOUNTER — Ambulatory Visit: Payer: BC Managed Care – PPO | Admitting: "Endocrinology

## 2017-09-25 ENCOUNTER — Other Ambulatory Visit: Payer: Self-pay | Admitting: Adult Health

## 2017-09-25 MED ORDER — SIMVASTATIN 20 MG PO TABS: ORAL_TABLET | ORAL | 3 refills | Status: DC

## 2017-09-25 NOTE — Progress Notes (Signed)
Refilled zocor 

## 2017-09-27 ENCOUNTER — Ambulatory Visit: Payer: BC Managed Care – PPO | Admitting: "Endocrinology

## 2017-10-02 LAB — COMPLETE METABOLIC PANEL WITH GFR
AG RATIO: 2 (calc) (ref 1.0–2.5)
ALKALINE PHOSPHATASE (APISO): 67 U/L (ref 33–115)
ALT: 24 U/L (ref 6–29)
AST: 13 U/L (ref 10–35)
Albumin: 4.5 g/dL (ref 3.6–5.1)
BILIRUBIN TOTAL: 0.3 mg/dL (ref 0.2–1.2)
BUN: 12 mg/dL (ref 7–25)
CALCIUM: 9.7 mg/dL (ref 8.6–10.2)
CHLORIDE: 107 mmol/L (ref 98–110)
CO2: 25 mmol/L (ref 20–32)
Creat: 0.58 mg/dL (ref 0.50–1.10)
GFR, EST NON AFRICAN AMERICAN: 108 mL/min/{1.73_m2} (ref >=60)
GFR, Est African American: 125 mL/min/{1.73_m2} (ref >=60)
Globulin: 2.2 g/dL (calc) (ref 1.9–3.7)
Glucose, Bld: 144 mg/dL — ABNORMAL HIGH (ref 65–99)
POTASSIUM: 4.3 mmol/L (ref 3.5–5.3)
Sodium: 140 mmol/L (ref 135–146)
Total Protein: 6.7 g/dL (ref 6.1–8.1)

## 2017-10-02 LAB — TSH: TSH: 1.59 mIU/L

## 2017-10-02 LAB — HEMOGLOBIN A1C
Hgb A1c MFr Bld: 6.5 % of total Hgb — ABNORMAL HIGH (ref <5.7)
MEAN PLASMA GLUCOSE: 140 (calc)
eAG (mmol/L): 7.7 (calc)

## 2017-10-02 LAB — VITAMIN D 25 HYDROXY (VIT D DEFICIENCY, FRACTURES): VIT D 25 HYDROXY: 28 ng/mL — AB (ref 30–100)

## 2017-10-02 LAB — T4, FREE: Free T4: 1.1 ng/dL (ref 0.8–1.8)

## 2017-10-16 ENCOUNTER — Encounter: Payer: Self-pay | Admitting: "Endocrinology

## 2017-10-16 ENCOUNTER — Ambulatory Visit (INDEPENDENT_AMBULATORY_CARE_PROVIDER_SITE_OTHER): Payer: BC Managed Care – PPO | Admitting: "Endocrinology

## 2017-10-16 VITALS — BP 132/80 | HR 92 | Ht 65.0 in | Wt 225.0 lb

## 2017-10-16 DIAGNOSIS — E1165 Type 2 diabetes mellitus with hyperglycemia: Secondary | ICD-10-CM

## 2017-10-16 DIAGNOSIS — IMO0001 Reserved for inherently not codable concepts without codable children: Secondary | ICD-10-CM

## 2017-10-16 DIAGNOSIS — I10 Essential (primary) hypertension: Secondary | ICD-10-CM | POA: Diagnosis not present

## 2017-10-16 DIAGNOSIS — E782 Mixed hyperlipidemia: Secondary | ICD-10-CM | POA: Diagnosis not present

## 2017-10-16 MED ORDER — EMPAGLIFLOZIN-METFORMIN HCL 12.5-500 MG PO TABS: 1.0000 | ORAL_TABLET | Freq: Two times a day (BID) | ORAL | 3 refills | Status: DC

## 2017-10-16 NOTE — Progress Notes (Signed)
Subjective:    Patient ID: Dana Lynch, female    DOB: [birth year]. Patient is being seen in follow up for management of diabetes requested by  Sinda Du, MD  Past Medical History:  Diagnosis Date  . Bowel habit changes 12/22/2013   Alternates constipation and diarrhea. Will refer to GI   . Diabetes ([place]) 02/16/2014  . Fibroids 12/22/2013  . Hypertension   . Unspecified symptom associated with female genital organs 12/22/2013   Past Surgical History:  Procedure Laterality Date  . APPENDECTOMY    . BREAST BIOPSY    . ENDOMETRIAL ABLATION    . TONSILLECTOMY AND ADENOIDECTOMY     Social History   Socioeconomic History  . Marital status: Married    Spouse name: Not on file  . Number of children: Not on file  . Years of education: Not on file  . Highest education level: Not on file  Occupational History  . Not on file  Social Needs  . Financial resource strain: Not on file  . Food insecurity:    Worry: Not on file    Inability: Not on file  . Transportation needs:    Medical: Not on file    Non-medical: Not on file  Tobacco Use  . Smoking status: Never Smoker  . Smokeless tobacco: Never Used  Substance and Sexual Activity  . Alcohol use: Yes    Comment: occ  . Drug use: No  . Sexual activity: Yes    Birth control/protection: Surgical    Comment: ablation  Lifestyle  . Physical activity:    Days per week: Not on file    Minutes per session: Not on file  . Stress: Not on file  Relationships  . Social connections:    Talks on phone: Not on file    Gets together: Not on file    Attends religious service: Not on file    Active member of club or organization: Not on file    Attends meetings of clubs or organizations: Not on file    Relationship status: Not on file  Other Topics Concern  . Not on file  Social History Narrative  . Not on file   Outpatient Encounter Medications as of 10/16/2017  Medication Sig  . blood glucose meter kit and supplies  KIT Dispense based on patient and insurance preference. Use up to four times daily as directed. (FOR ICD-10 E11.65)  . Cholecalciferol 5000 units capsule Take 1 capsule (5,000 Units total) by mouth daily.  . Empagliflozin-metFORMIN HCl (SYNJARDY) 12.5-500 MG TABS Take 1 tablet by mouth 2 (two) times daily with a meal.  . lisinopril (PRINIVIL,ZESTRIL) 5 MG tablet Take 1 tablet (5 mg total) by mouth daily.  . simvastatin (ZOCOR) 20 MG tablet TAKE 1 TABLET(20 MG) BY MOUTH DAILY  . [DISCONTINUED] SYNJARDY 12.5-500 MG TABS TAKE 1 TABLET BY MOUTH TWICE DAILY AFTER A MEAL   No facility-administered encounter medications on file as of 10/16/2017.    ALLERGIES: No Known Allergies VACCINATION STATUS:  There is no immunization history on file for this patient.  Diabetes  She presents for her follow-up diabetic visit. She has type 2 diabetes mellitus. Onset time: She was diagnosed at approximate age of 47 years. Her disease course has been stable. There are no hypoglycemic associated symptoms. Pertinent negatives for hypoglycemia include no confusion, headaches, pallor or seizures. Pertinent negatives for diabetes include no blurred vision, no chest pain, no fatigue, no polydipsia, no polyphagia and no polyuria. There  are no hypoglycemic complications. Symptoms are stable. There are no diabetic complications. Risk factors for coronary artery disease include diabetes mellitus, dyslipidemia, family history, obesity, hypertension and sedentary lifestyle. Current diabetic treatment includes oral agent (monotherapy) (She is on metformin 500 mg by mouth daily.). Her weight is increasing steadily. She is following a generally unhealthy diet. When asked about meal planning, she reported none. She has not had a previous visit with a dietitian. She rarely participates in exercise. An ACE inhibitor/angiotensin II receptor blocker is being taken.  Hyperlipidemia  This is a chronic problem. The current episode started more  than 1 year ago. The problem is uncontrolled. Exacerbating diseases include diabetes and obesity. Pertinent negatives include no chest pain, myalgias or shortness of breath. Current antihyperlipidemic treatment includes statins. Risk factors for coronary artery disease include dyslipidemia, family history, diabetes mellitus, obesity, hypertension and a sedentary lifestyle.  Hypertension  This is a chronic problem. The current episode started more than 1 year ago. The problem is controlled. Pertinent negatives include no blurred vision, chest pain, headaches, palpitations or shortness of breath. Risk factors for coronary artery disease include diabetes mellitus, dyslipidemia, family history, obesity and sedentary lifestyle. Past treatments include ACE inhibitors.    Review of Systems  Constitutional: Negative for chills, fatigue, fever and unexpected weight change.  HENT: Negative for trouble swallowing and voice change.   Eyes: Negative for blurred vision and visual disturbance.  Respiratory: Negative for cough, shortness of breath and wheezing.   Cardiovascular: Negative for chest pain, palpitations and leg swelling.  Gastrointestinal: Negative for diarrhea, nausea and vomiting.  Endocrine: Negative for cold intolerance, heat intolerance, polydipsia, polyphagia and polyuria.  Musculoskeletal: Negative for arthralgias and myalgias.  Skin: Negative for color change, pallor, rash and wound.  Neurological: Negative for seizures and headaches.  Psychiatric/Behavioral: Negative for confusion and suicidal ideas.    Objective:    BP 132/80   Pulse 92   Ht 5' 5" (1.651 m)   Wt 225 lb (102.1 kg)   BMI 37.44 kg/m   Wt Readings from Last 3 Encounters:  10/16/17 225 lb (102.1 kg)  06/20/17 217 lb (98.4 kg)  03/26/17 221 lb (100.2 kg)    Physical Exam  Constitutional: She is oriented to person, place, and time. She appears well-developed.  HENT:  Head: Normocephalic and atraumatic.  Eyes: EOM  are normal.  Neck: Normal range of motion. Neck supple. No tracheal deviation present. No thyromegaly present.  Cardiovascular: Normal rate and regular rhythm.  Pulmonary/Chest: Effort normal and breath sounds normal.  Abdominal: Soft. Bowel sounds are normal. There is no tenderness. There is no guarding.  Musculoskeletal: Normal range of motion. She exhibits no edema.  Neurological: She is alert and oriented to person, place, and time. She has normal reflexes. No cranial nerve deficit. Coordination normal.  Skin: Skin is warm and dry. No rash noted. No erythema. No pallor.  Psychiatric: She has a normal mood and affect. Judgment normal.     CMP     Component Value Date/Time   NA 140 10/01/2017 0747   NA 139 05/21/2017 1208   K 4.3 10/01/2017 0747   CL 107 10/01/2017 0747   CO2 25 10/01/2017 0747   GLUCOSE 144 (H) 10/01/2017 0747   BUN 12 10/01/2017 0747   BUN 15 05/21/2017 1208   CREATININE 0.58 10/01/2017 0747   CALCIUM 9.7 10/01/2017 0747   PROT 6.7 10/01/2017 0747   PROT 7.0 01/19/2017 0955   ALBUMIN 5.0 05/21/2017 1208  AST 13 10/01/2017 0747   ALT 24 10/01/2017 0747   ALKPHOS 107 01/19/2017 0955   BILITOT 0.3 10/01/2017 0747   BILITOT 0.5 01/19/2017 0955   GFRNONAA 108 10/01/2017 0747   GFRAA 125 10/01/2017 0747     Diabetic Labs (most recent): Lab Results  Component Value Date   HGBA1C 6.5 (H) 10/01/2017   HGBA1C 6.7 (H) 05/21/2017   HGBA1C 13.5 (H) 01/19/2017     Lipid Panel ( most recent) Lipid Panel     Component Value Date/Time   CHOL 194 01/19/2017 0955   TRIG 282 (H) 01/19/2017 0955   HDL 33 (L) 01/19/2017 0955   CHOLHDL 5.9 (H) 01/19/2017 0955   CHOLHDL 4.7 04/20/2014 0915   VLDL 14 04/20/2014 0915   LDLCALC 105 (H) 01/19/2017 0955    Assessment & Plan:   1. Uncontrolled type 2 diabetes mellitus without complication, without long-term current use of insulin ([place])  - Patient has currently controlled asymptomatic type 2 DM since 47 years of  age. - She came with stable A1c of 6.5%, generally improving from 13.5%.  This is despite exposure to tapering steroid therapy for upper respiratory tract infections on 3 occasions since her last visit.  - Recent labs reviewed.   She does not report gross complications from diabetes, however she remains at a high risk for more acute and chronic complications which include CAD, CVA, CKD, retinopathy, and neuropathy. These are all discussed in detail with the patient.  - I have counseled the patient on diet management and weight loss, by adopting a carbohydrate restricted/protein rich diet.  -  Suggestion is made for her to avoid simple carbohydrates  from her diet including Cakes, Sweet Desserts / Pastries, Ice Cream, Soda (diet and regular), Sweet Tea, Candies, Chips, Cookies, Store Bought Juices, Alcohol in Excess of  1-2 drinks a day, Artificial Sweeteners, and "Sugar-free" Products. This will help patient to have stable blood glucose profile and potentially avoid unintended weight gain.   - I encouraged the patient to switch to  unprocessed or minimally processed complex starch and increased protein intake (animal or plant source), fruits, and vegetables.  - Patient is advised to stick to a routine mealtimes to eat 3 meals  a day and avoid unnecessary snacks ( to snack only to correct hypoglycemia).   - I have approached patient with the following individualized plan to manage diabetes and patient agrees:   - Based on her presentation with improved A1c of 6.5%, she will  not require additional treatment for diabetes at this time. - I advised her to continue  Synjardy 12.5/500 mg by mouth twice a day. Side effects and precautions discussed with her.  - Patient will be considered for incretin therapy as appropriate next visit if she does not achieve control. - Patient specific target  A1c;  LDL, HDL, Triglycerides, and  Waist Circumference were discussed in detail.  2) BP/HTN:  Her blood  pressure is controlled. Continue current medications including ACEI/ARB. 3) Lipids/HPL:   Uncontrolled, with LDL 105,  hypertriglyceridemia of 280.   Patient is advised to continue statins. Better control of diabetes will help with triglycerides as well, will have fasting lipid panel before her next visit.  4)  Weight/Diet: CDE Consult has been requested , exercise, and detailed carbohydrates information provided.  5) Chronic Care/Health Maintenance:  -Patient is on ACEI/ARB and Statin medications and encouraged to continue to follow up with Ophthalmology, Podiatrist at least yearly or according to recommendations, and advised to  stay away from smoking. I have recommended yearly flu vaccine and pneumonia vaccination at least every 5 years; moderate intensity exercise for up to 150 minutes weekly; and  sleep for at least 7 hours a day.  - I advised patient to maintain close follow up with Sinda Du, MD for primary care needs.  - Time spent with the patient: 25 min, of which >50% was spent in reviewing her  current and  previous labs, previous treatments, and medications doses and developing a plan for long-term care.  [name] participated in the discussions, expressed understanding, and voiced agreement with the above plans.  All questions were answered to her satisfaction. she is encouraged to contact clinic should she have any questions or concerns prior to her return visit.   Follow up plan: - Return in about 4 months (around 02/15/2018) for follow up with pre-visit labs.  Glade Lloyd, MD Phone: [phone]  Fax: [phone]  -  This note was partially dictated with voice recognition software. Similar sounding words can be transcribed inadequately or may not  be corrected upon review.  10/16/2017, 4:34 PM

## 2017-10-16 NOTE — Patient Instructions (Signed)

## 2018-02-18 ENCOUNTER — Ambulatory Visit: Payer: BC Managed Care – PPO | Admitting: "Endocrinology

## 2018-04-25 ENCOUNTER — Other Ambulatory Visit: Payer: Self-pay | Admitting: Adult Health

## 2018-05-06 ENCOUNTER — Other Ambulatory Visit: Payer: Self-pay | Admitting: "Endocrinology

## 2018-05-06 DIAGNOSIS — E1165 Type 2 diabetes mellitus with hyperglycemia: Secondary | ICD-10-CM

## 2018-05-07 ENCOUNTER — Other Ambulatory Visit: Payer: Self-pay | Admitting: Adult Health

## 2018-05-07 DIAGNOSIS — Z1231 Encounter for screening mammogram for malignant neoplasm of breast: Secondary | ICD-10-CM

## 2018-05-07 LAB — COMPREHENSIVE METABOLIC PANEL
AG Ratio: 2.1 (calc) (ref 1.0–2.5)
ALT: 37 U/L — AB (ref 6–29)
AST: 14 U/L (ref 10–35)
Albumin: 4.4 g/dL (ref 3.6–5.1)
Alkaline phosphatase (APISO): 69 U/L (ref 33–115)
BUN: 16 mg/dL (ref 7–25)
CO2: 25 mmol/L (ref 20–32)
CREATININE: 0.62 mg/dL (ref 0.50–1.10)
Calcium: 9.6 mg/dL (ref 8.6–10.2)
Chloride: 108 mmol/L (ref 98–110)
GLUCOSE: 189 mg/dL — AB (ref 65–99)
Globulin: 2.1 g/dL (calc) (ref 1.9–3.7)
Potassium: 4.3 mmol/L (ref 3.5–5.3)
SODIUM: 139 mmol/L (ref 135–146)
TOTAL PROTEIN: 6.5 g/dL (ref 6.1–8.1)
Total Bilirubin: 0.5 mg/dL (ref 0.2–1.2)

## 2018-05-07 LAB — HEMOGLOBIN A1C
EAG (MMOL/L): 8.5 (calc)
Hgb A1c MFr Bld: 7 % of total Hgb — ABNORMAL HIGH (ref <5.7)
Mean Plasma Glucose: 154 (calc)

## 2018-06-18 ENCOUNTER — Ambulatory Visit
Admission: RE | Admit: 2018-06-18 | Discharge: 2018-06-18 | Disposition: A | Discharge status: Home / Self Care | Payer: BC Managed Care – PPO | Source: Ambulatory Visit | Attending: Adult Health | Admitting: Adult Health

## 2018-06-18 DIAGNOSIS — Z1231 Encounter for screening mammogram for malignant neoplasm of breast: Secondary | ICD-10-CM

## 2018-06-20 ENCOUNTER — Other Ambulatory Visit: Payer: Self-pay | Admitting: Adult Health

## 2018-06-20 DIAGNOSIS — R928 Other abnormal and inconclusive findings on diagnostic imaging of breast: Secondary | ICD-10-CM

## 2018-06-24 ENCOUNTER — Ambulatory Visit
Admission: RE | Admit: 2018-06-24 | Discharge: 2018-06-24 | Disposition: A | Discharge status: Home / Self Care | Payer: BC Managed Care – PPO | Source: Ambulatory Visit | Attending: Adult Health | Admitting: Adult Health

## 2018-06-24 DIAGNOSIS — R928 Other abnormal and inconclusive findings on diagnostic imaging of breast: Secondary | ICD-10-CM

## 2018-06-26 ENCOUNTER — Ambulatory Visit (INDEPENDENT_AMBULATORY_CARE_PROVIDER_SITE_OTHER): Payer: BC Managed Care – PPO | Admitting: "Endocrinology

## 2018-06-26 ENCOUNTER — Encounter: Payer: Self-pay | Admitting: "Endocrinology

## 2018-06-26 VITALS — BP 118/86 | HR 95 | Ht 65.0 in | Wt 230.0 lb

## 2018-06-26 DIAGNOSIS — I10 Essential (primary) hypertension: Secondary | ICD-10-CM

## 2018-06-26 DIAGNOSIS — E1165 Type 2 diabetes mellitus with hyperglycemia: Secondary | ICD-10-CM | POA: Diagnosis not present

## 2018-06-26 DIAGNOSIS — E782 Mixed hyperlipidemia: Secondary | ICD-10-CM | POA: Diagnosis not present

## 2018-06-26 MED ORDER — METFORMIN HCL ER 500 MG PO TB24: 500.0000 mg | ORAL_TABLET | Freq: Two times a day (BID) | ORAL | 1 refills | Status: DC

## 2018-06-26 NOTE — Patient Instructions (Signed)

## 2018-06-26 NOTE — Progress Notes (Signed)
Subjective:    Patient ID: Dana Lynch, female    DOB: January 27, 1971. Patient is being seen in follow up for management of diabetes requested by  Sinda Du, MD  Past Medical History:  Diagnosis Date  . Bowel habit changes 12/22/2013   Alternates constipation and diarrhea. Will refer to GI   . Diabetes ([place]) 02/16/2014  . Fibroids 12/22/2013  . Hypertension   . Unspecified symptom associated with female genital organs 12/22/2013   Past Surgical History:  Procedure Laterality Date  . APPENDECTOMY    . BREAST BIOPSY    . ENDOMETRIAL ABLATION    . TONSILLECTOMY AND ADENOIDECTOMY     Social History   Socioeconomic History  . Marital status: Married    Spouse name: Not on file  . Number of children: Not on file  . Years of education: Not on file  . Highest education level: Not on file  Occupational History  . Not on file  Social Needs  . Financial resource strain: Not on file  . Food insecurity:    Worry: Not on file    Inability: Not on file  . Transportation needs:    Medical: Not on file    Non-medical: Not on file  Tobacco Use  . Smoking status: Never Smoker  . Smokeless tobacco: Never Used  Substance and Sexual Activity  . Alcohol use: Yes    Comment: occ  . Drug use: No  . Sexual activity: Yes    Birth control/protection: Surgical    Comment: ablation  Lifestyle  . Physical activity:    Days per week: Not on file    Minutes per session: Not on file  . Stress: Not on file  Relationships  . Social connections:    Talks on phone: Not on file    Gets together: Not on file    Attends religious service: Not on file    Active member of club or organization: Not on file    Attends meetings of clubs or organizations: Not on file    Relationship status: Not on file  Other Topics Concern  . Not on file  Social History Narrative  . Not on file   Outpatient Encounter Medications as of 06/26/2018  Medication Sig  . blood glucose meter kit and supplies  KIT Dispense based on patient and insurance preference. Use up to four times daily as directed. (FOR ICD-10 E11.65)  . Cholecalciferol 5000 units capsule Take 1 capsule (5,000 Units total) by mouth daily.  [place] Kitchen lisinopril (PRINIVIL,ZESTRIL) 5 MG tablet TAKE 1 TABLET(5 MG) BY MOUTH DAILY  . metFORMIN (GLUCOPHAGE XR) 500 MG 24 hr tablet Take 1 tablet (500 mg total) by mouth 2 (two) times daily after a meal.  . simvastatin (ZOCOR) 20 MG tablet TAKE 1 TABLET(20 MG) BY MOUTH DAILY  . [DISCONTINUED] Empagliflozin-metFORMIN HCl (SYNJARDY) 12.5-500 MG TABS Take 1 tablet by mouth 2 (two) times daily with a meal.   No facility-administered encounter medications on file as of 06/26/2018.    ALLERGIES: No Known Allergies VACCINATION STATUS:  There is no immunization history on file for this patient.  Diabetes  She presents for her follow-up diabetic visit. She has type 2 diabetes mellitus. Onset time: She was diagnosed at approximate age of 47 years. Her disease course has been stable. There are no hypoglycemic associated symptoms. Pertinent negatives for hypoglycemia include no confusion, headaches, pallor or seizures. Pertinent negatives for diabetes include no blurred vision, no chest pain, no fatigue, no  polydipsia, no polyphagia and no polyuria. There are no hypoglycemic complications. Symptoms are stable. There are no diabetic complications. Risk factors for coronary artery disease include diabetes mellitus, dyslipidemia, family history, obesity, hypertension and sedentary lifestyle. Current diabetic treatment includes oral agent (monotherapy) (She is on metformin 500 mg by mouth daily.). Her weight is increasing steadily. She is following a generally unhealthy diet. When asked about meal planning, she reported none. She has not had a previous visit with a dietitian. She rarely participates in exercise. An ACE inhibitor/angiotensin II receptor blocker is being taken.  Hyperlipidemia  This is a chronic  problem. The current episode started more than 1 year ago. The problem is uncontrolled. Exacerbating diseases include diabetes and obesity. Pertinent negatives include no chest pain, myalgias or shortness of breath. Current antihyperlipidemic treatment includes statins. Risk factors for coronary artery disease include dyslipidemia, family history, diabetes mellitus, obesity, hypertension and a sedentary lifestyle.  Hypertension  This is a chronic problem. The current episode started more than 1 year ago. The problem is controlled. Pertinent negatives include no blurred vision, chest pain, headaches, palpitations or shortness of breath. Risk factors for coronary artery disease include diabetes mellitus, dyslipidemia, family history, obesity and sedentary lifestyle. Past treatments include ACE inhibitors.    Review of Systems  Constitutional: Negative for chills, fatigue, fever and unexpected weight change.  HENT: Negative for trouble swallowing and voice change.   Eyes: Negative for blurred vision and visual disturbance.  Respiratory: Negative for cough, shortness of breath and wheezing.   Cardiovascular: Negative for chest pain, palpitations and leg swelling.  Gastrointestinal: Negative for diarrhea, nausea and vomiting.  Endocrine: Negative for cold intolerance, heat intolerance, polydipsia, polyphagia and polyuria.  Musculoskeletal: Negative for arthralgias and myalgias.  Skin: Negative for color change, pallor, rash and wound.  Neurological: Negative for seizures and headaches.  Psychiatric/Behavioral: Negative for confusion and suicidal ideas.    Objective:    BP 118/86   Pulse 95   Ht _0  (1.651 m)   Wt 230 lb (104.3 kg)   LMP  (LMP Unknown)   BMI 38.27 kg/m   Wt Readings from Last 3 Encounters:  06/26/18 230 lb (104.3 kg)  10/16/17 225 lb (102.1 kg)  06/20/17 217 lb (98.4 kg)    Physical Exam  Constitutional: She is oriented to person, place, and time. She appears  well-developed.  HENT:  Head: Normocephalic and atraumatic.  Eyes: EOM are normal.  Neck: Normal range of motion. Neck supple. No tracheal deviation present. No thyromegaly present.  Cardiovascular: Normal rate and regular rhythm.  Pulmonary/Chest: Effort normal and breath sounds normal.  Abdominal: Soft. Bowel sounds are normal. There is no abdominal tenderness. There is no guarding.  Musculoskeletal: Normal range of motion.        General: No edema.  Neurological: She is alert and oriented to person, place, and time. She has normal reflexes. No cranial nerve deficit. Coordination normal.  Skin: Skin is warm and dry. No rash noted. No erythema. No pallor.  Psychiatric: She has a normal mood and affect. Judgment normal.     CMP     Component Value Date/Time   NA 139 05/06/2018 0821   NA 139 05/21/2017 1208   K 4.3 05/06/2018 0821   CL 108 05/06/2018 0821   CO2 25 05/06/2018 0821   GLUCOSE 189 (H) 05/06/2018 0821   BUN 16 05/06/2018 0821   BUN 15 05/21/2017 1208   CREATININE 0.62 05/06/2018 0821   CALCIUM 9.6 05/06/2018 1610  PROT 6.5 05/06/2018 0821   PROT 7.0 01/19/2017 0955   ALBUMIN 5.0 05/21/2017 1208   AST 14 05/06/2018 0821   ALT 37 (H) 05/06/2018 0821   ALKPHOS 107 01/19/2017 0955   BILITOT 0.5 05/06/2018 0821   BILITOT 0.5 01/19/2017 0955   GFRNONAA 108 10/01/2017 0747   GFRAA 125 10/01/2017 0747     Diabetic Labs (most recent): Lab Results  Component Value Date   HGBA1C 7.0 (H) 05/06/2018   HGBA1C 6.5 (H) 10/01/2017   HGBA1C 6.7 (H) 05/21/2017     Lipid Panel ( most recent) Lipid Panel     Component Value Date/Time   CHOL 194 01/19/2017 0955   TRIG 282 (H) 01/19/2017 0955   HDL 33 (L) 01/19/2017 0955   CHOLHDL 5.9 (H) 01/19/2017 0955   CHOLHDL 4.7 04/20/2014 0915   VLDL 14 04/20/2014 0915   LDLCALC 105 (H) 01/19/2017 0955    Assessment & Plan:   1. Uncontrolled type 2 diabetes mellitus without complication, without long-term current use of  insulin ([place])  - Patient has currently controlled asymptomatic type 2 DM since 47 years of age. - She came with slowly increasing A1c of 7%, after generally improving from 13.5%.   -However, she is experiencing UTI/yeast infection almost every other week, suspects likely due to Progress.  - Recent labs reviewed.   She does not report gross complications from diabetes, however she remains at a high risk for more acute and chronic complications which include CAD, CVA, CKD, retinopathy, and neuropathy. These are all discussed in detail with the patient.  - I have counseled the patient on diet management and weight loss, by adopting a carbohydrate restricted/protein rich diet. She still admits to dietary indiscretion including consumption of sweetened beverages. -  Suggestion is made for her to avoid simple carbohydrates  from her diet including Cakes, Sweet Desserts / Pastries, Ice Cream, Soda (diet and regular), Sweet Tea, Candies, Chips, Cookies, Store Bought Juices, Alcohol in Excess of  1-2 drinks a day, Artificial Sweeteners, and "Sugar-free" Products. This will help patient to have stable blood glucose profile and potentially avoid unintended weight gain.  - I encouraged the patient to switch to  unprocessed or minimally processed complex starch and increased protein intake (animal or plant source), fruits, and vegetables.  - Patient is advised to stick to a routine mealtimes to eat 3 meals  a day and avoid unnecessary snacks ( to snack only to correct hypoglycemia).   - I have approached patient with the following individualized plan to manage diabetes and patient agrees:   - Based on her presentation with frequent UTI/yeast infection, I advised her to discontinue Synjardy.  She will be reinitiated on metformin 500 mg XR twice daily after breakfast and supper.    - Patient will be considered for incretin therapy as appropriate next visit if she does not achieve control. - Patient specific  target  A1c;  LDL, HDL, Triglycerides, and  Waist Circumference were discussed in detail.  2) BP/HTN:  Her blood pressure is controlled. Continue current medications including ACEI/ARB. 3) Lipids/HPL:   Her lipid panel shows uncontrolled LDL at 105,   hypertriglyceridemia of 280.   She is advised to continue simvastatin 20 mg p.o. nightly.   Better control of diabetes will help with triglycerides as well, will have fasting lipid panel before her next visit.  4)  Weight/Diet: CDE Consult has been requested , exercise, and detailed carbohydrates information provided.  5) Chronic Care/Health Maintenance:  -Patient  is on ACEI/ARB and Statin medications and encouraged to continue to follow up with Ophthalmology, Podiatrist at least yearly or according to recommendations, and advised to  stay away from smoking. I have recommended yearly flu vaccine and pneumonia vaccination at least every 5 years; moderate intensity exercise for up to 150 minutes weekly; and  sleep for at least 7 hours a day.  - I advised patient to maintain close follow up with Sinda Du, MD for primary care needs.  - Time spent with the patient: 25 min, of which >50% was spent in reviewing her  current and  previous labs, previous treatments, and medications doses and developing a plan for long-term care.  [name] participated in the discussions, expressed understanding, and voiced agreement with the above plans.  All questions were answered to her satisfaction. she is encouraged to contact clinic should she have any questions or concerns prior to her return visit.  Follow up plan: - Return in about 6 months (around 12/26/2018) for Follow up with Pre-visit Labs.  Glade Lloyd, MD Phone: [phone]  Fax: [phone]  -  This note was partially dictated with voice recognition software. Similar sounding words can be transcribed inadequately or may not  be corrected upon review.  06/26/2018, 5:14 PM

## 2018-08-09 ENCOUNTER — Other Ambulatory Visit: Payer: Self-pay | Admitting: Adult Health

## 2018-12-24 ENCOUNTER — Other Ambulatory Visit: Payer: BC Managed Care – PPO

## 2018-12-26 ENCOUNTER — Encounter: Payer: Self-pay | Admitting: "Endocrinology

## 2018-12-26 ENCOUNTER — Ambulatory Visit: Payer: BC Managed Care – PPO | Admitting: "Endocrinology

## 2019-01-23 ENCOUNTER — Telehealth: Payer: Self-pay | Admitting: "Endocrinology

## 2019-01-23 ENCOUNTER — Telehealth: Payer: Self-pay

## 2019-01-23 DIAGNOSIS — E1165 Type 2 diabetes mellitus with hyperglycemia: Secondary | ICD-10-CM

## 2019-01-23 DIAGNOSIS — E782 Mixed hyperlipidemia: Secondary | ICD-10-CM

## 2019-01-23 NOTE — Telephone Encounter (Signed)
Dana Lynch, CMA  

## 2019-01-23 NOTE — Telephone Encounter (Signed)
Patient needs updated lab order in computer. [facility] is going [facility]

## 2019-01-23 NOTE — Telephone Encounter (Signed)
Done

## 2019-02-11 ENCOUNTER — Ambulatory Visit: Payer: BC Managed Care – PPO | Admitting: "Endocrinology

## 2019-02-19 ENCOUNTER — Other Ambulatory Visit: Payer: BC Managed Care – PPO | Admitting: Adult Health

## 2019-02-28 ENCOUNTER — Telehealth: Payer: Self-pay | Admitting: Obstetrics and Gynecology

## 2019-02-28 NOTE — Telephone Encounter (Signed)
Called patient and left message informing her that we are not allowing any visitors or children to come to visit with her at this time and we are requiring a mask to be worn during the visit. Asked if she has had any exposure to anyone suspected or confirmed of having COVID-19 or if she is experiencing any of the following: fever, cough, sob, muscle pain, severe headache, sore throat, diarrhea, loss of taste or smell or ear, nose or throat discomfort to call and reschedule.   °

## 2019-03-03 ENCOUNTER — Other Ambulatory Visit: Payer: Self-pay

## 2019-03-03 ENCOUNTER — Ambulatory Visit (INDEPENDENT_AMBULATORY_CARE_PROVIDER_SITE_OTHER): Payer: BC Managed Care – PPO | Admitting: Obstetrics and Gynecology

## 2019-03-03 ENCOUNTER — Encounter: Payer: Self-pay | Admitting: Obstetrics and Gynecology

## 2019-03-03 ENCOUNTER — Other Ambulatory Visit (HOSPITAL_COMMUNITY)
Admission: RE | Admit: 2019-03-03 | Discharge: 2019-03-03 | Disposition: A | Discharge status: Home / Self Care | Payer: BC Managed Care – PPO | Source: Ambulatory Visit | Attending: Obstetrics and Gynecology | Admitting: Obstetrics and Gynecology

## 2019-03-03 VITALS — BP 123/78 | HR 98 | Ht 69.0 in | Wt 208.0 lb

## 2019-03-03 DIAGNOSIS — Z01419 Encounter for gynecological examination (general) (routine) without abnormal findings: Secondary | ICD-10-CM | POA: Diagnosis not present

## 2019-03-03 DIAGNOSIS — Z86018 Personal history of other benign neoplasm: Secondary | ICD-10-CM | POA: Diagnosis not present

## 2019-03-03 NOTE — Progress Notes (Addendum)
Patient ID: Dana Lynch, female   DOB: Sep 17, 1970, 48 y.o.   MRN: [id number]  Assessment:  Annual Gyn Exam Enlarged uterus suspect fibroids, was 450 gm fibroid uterus in 2015 Plan:  1. pap smear done, next pap due 2 years 2. Schedule colonoscopy 3   Schedule tv u/s per pt concern level due to fam hx cancers. Subjective:  Dana Lynch is a 48 y.o. female G3P3 who presents for annual exam. No LMP recorded. Patient is perimenopausal. The patient has complaints today of none. Has had abnormal PAP. This is her 2nd years of since abnormal PAP. No longer having period, after ablation. Has fhx of cancer. Has had u/s done before with fibroids on her ovaries.  Pt reports that she has a lot of cancer in fam hx   The following portions of the patient's history were reviewed and updated as appropriate: allergies, current medications, past family history, past medical history, past social history, past surgical history and problem list. Past Medical History:  Diagnosis Date  . Bowel habit changes 12/22/2013   Alternates constipation and diarrhea. Will refer to GI   . Diabetes ([place]) 02/16/2014  . Fibroids 12/22/2013  . Hypertension   . Unspecified symptom associated with female genital organs 12/22/2013    Past Surgical History:  Procedure Laterality Date  . APPENDECTOMY    . BREAST BIOPSY    . ENDOMETRIAL ABLATION    . TONSILLECTOMY AND ADENOIDECTOMY       Current Outpatient Medications:  .  blood glucose meter kit and supplies KIT, Dispense based on patient and insurance preference. Use up to four times daily as directed. (FOR ICD-10 E11.65), Disp: 1 each, Rfl: 0 .  Cholecalciferol 5000 units capsule, Take 1 capsule (5,000 Units total) by mouth daily., Disp: , Rfl:  .  lisinopril (PRINIVIL,ZESTRIL) 5 MG tablet, TAKE 1 TABLET(5 MG) BY MOUTH DAILY, Disp: 30 tablet, Rfl: 11 .  metFORMIN (GLUCOPHAGE XR) 500 MG 24 hr tablet, Take 1 tablet (500 mg total) by mouth 2 (two) times daily after a  meal., Disp: 180 tablet, Rfl: 1 .  simvastatin (ZOCOR) 20 MG tablet, TAKE 1 TABLET(20 MG) BY MOUTH DAILY, Disp: 30 tablet, Rfl: 3  Review of Systems Constitutional: negative Gastrointestinal: negative Genitourinary: normal  Objective:  There were no vitals taken for this visit.   BMI: There is no height or weight on file to calculate BMI.  General Appearance: Alert, appropriate appearance for age. No acute distress HEENT: Grossly normal Neck / Thyroid:  Cardiovascular: RRR; normal S1, S2, no murmur Lungs: CTA bilaterally Back: No CVAT Breast Exam: Not examined. Gastrointestinal: Soft, non-tender, no masses or organomegaly Pelvic Exam:  VAGINA: normal appearing vagina CERVIX: normal appearing cervix, deviated slightly to the left  UTERUS: enlarged to 12 week's size. RECTAL: guaiac negative stool obtained, PAP: Pap smear done today. Lymphatic Exam: Non-palpable nodes in neck, clavicular, axillary, or inguinal regions  Skin: no rash or abnormalities Neurologic: Normal gait and speech, no tremor  Psychiatric: Alert and oriented, appropriate affect.  Urinalysis:normal  By signing my name below, I, Samul Dada, attest that this documentation has been prepared under the direction and in the presence of Jonnie Kind, MD. Electronically Signed: [place]. 03/03/19. 3:47 PM.  I personally performed the services described in this documentation, which was SCRIBED in my presence. The recorded information has been reviewed and considered accurate. It has been edited as necessary during review. Jonnie Kind, MD

## 2019-03-06 LAB — COMPLETE METABOLIC PANEL WITH GFR
AG Ratio: 2.3 (calc) (ref 1.0–2.5)
ALT: 31 U/L — ABNORMAL HIGH (ref 6–29)
AST: 15 U/L (ref 10–35)
Albumin: 4.5 g/dL (ref 3.6–5.1)
Alkaline phosphatase (APISO): 91 U/L (ref 37–153)
BUN: 10 mg/dL (ref 7–25)
CO2: 26 mmol/L (ref 20–32)
Calcium: 9.4 mg/dL (ref 8.6–10.4)
Chloride: 102 mmol/L (ref 98–110)
Creat: 0.63 mg/dL (ref 0.50–1.05)
GFR, Est African American: 121 mL/min/{1.73_m2} (ref >=60)
GFR, Est Non African American: 105 mL/min/{1.73_m2} (ref >=60)
Globulin: 2 g/dL (calc) (ref 1.9–3.7)
Glucose, Bld: 390 mg/dL — ABNORMAL HIGH (ref 65–99)
Potassium: 4 mmol/L (ref 3.5–5.3)
Sodium: 136 mmol/L (ref 135–146)
Total Bilirubin: 0.6 mg/dL (ref 0.2–1.2)
Total Protein: 6.5 g/dL (ref 6.1–8.1)

## 2019-03-06 LAB — HEMOGLOBIN A1C: Hgb A1c MFr Bld: >14 % of total Hgb — ABNORMAL HIGH (ref <5.7)

## 2019-03-07 ENCOUNTER — Telehealth: Payer: Self-pay | Admitting: Obstetrics & Gynecology

## 2019-03-07 ENCOUNTER — Other Ambulatory Visit: Payer: Self-pay | Admitting: Obstetrics and Gynecology

## 2019-03-07 DIAGNOSIS — Z86018 Personal history of other benign neoplasm: Secondary | ICD-10-CM

## 2019-03-07 LAB — CYTOLOGY - PAP
Diagnosis: NEGATIVE
HPV: NOT DETECTED

## 2019-03-07 NOTE — Telephone Encounter (Signed)
Called patient and left message informing her that we are not allowing any visitors or children to come to visit with her at this time and we are requiring a mask to be worn during the visit. Asked if she has had any exposure to anyone suspected or confirmed of having COVID-19 or if she is experiencing any of the following: fever, cough, sob, muscle pain, severe headache, sore throat, diarrhea, loss of taste or smell or ear, nose or throat discomfort to call and reschedule.   °

## 2019-03-10 ENCOUNTER — Ambulatory Visit (INDEPENDENT_AMBULATORY_CARE_PROVIDER_SITE_OTHER): Payer: BC Managed Care – PPO

## 2019-03-10 ENCOUNTER — Other Ambulatory Visit: Payer: Self-pay

## 2019-03-10 ENCOUNTER — Telehealth: Payer: Self-pay | Admitting: *Deleted

## 2019-03-10 DIAGNOSIS — Z86018 Personal history of other benign neoplasm: Secondary | ICD-10-CM

## 2019-03-10 NOTE — Telephone Encounter (Signed)
Patient informed pap smear normal and can repeat in 3-5 years.  Pt verbalized gratitude.

## 2019-03-10 NOTE — Progress Notes (Signed)
PELVIC US TA/TV: enlarged heterogeneous anteverted uterus with a 9.2 x 9.4 x 7.5 cm fibroid mid uterus,unable to visualize the endometrium,normal ovaries bilat,limited view of right ovary,no free fluid,no pain during ultrasound

## 2019-03-12 ENCOUNTER — Other Ambulatory Visit: Payer: Self-pay

## 2019-03-12 ENCOUNTER — Encounter: Payer: Self-pay | Admitting: "Endocrinology

## 2019-03-12 ENCOUNTER — Ambulatory Visit: Payer: BC Managed Care – PPO | Admitting: "Endocrinology

## 2019-03-12 VITALS — BP 118/80 | HR 90 | Ht 65.0 in | Wt 213.0 lb

## 2019-03-12 DIAGNOSIS — E782 Mixed hyperlipidemia: Secondary | ICD-10-CM | POA: Diagnosis not present

## 2019-03-12 DIAGNOSIS — E1165 Type 2 diabetes mellitus with hyperglycemia: Secondary | ICD-10-CM | POA: Diagnosis not present

## 2019-03-12 DIAGNOSIS — I10 Essential (primary) hypertension: Secondary | ICD-10-CM | POA: Diagnosis not present

## 2019-03-12 MED ORDER — GLIPIZIDE ER 5 MG PO TB24: 5.0000 mg | ORAL_TABLET | Freq: Every day | ORAL | 3 refills | Status: DC

## 2019-03-12 MED ORDER — ONETOUCH VERIO W/DEVICE KIT: 1.0000 | PACK | 0 refills | Status: AC | PRN

## 2019-03-12 MED ORDER — ONETOUCH VERIO VI STRP: ORAL_STRIP | 2 refills | Status: AC

## 2019-03-12 MED ORDER — ONETOUCH VERIO VI STRP: ORAL_STRIP | 2 refills | Status: DC

## 2019-03-12 NOTE — Patient Instructions (Signed)
                                     Advice for Weight Management  -For most of us the best way to lose weight is by diet management. Generally speaking, diet management means consuming less calories intentionally which over time brings about progressive weight loss.  This can be achieved more effectively by restricting carbohydrate consumption to the minimum possible.  So, it is critically important to know your numbers: how much calorie you are consuming and how much calorie you need. More importantly, our carbohydrates sources should be unprocessed or minimally processed complex starch food items.   Sometimes, it is important to balance nutrition by increasing protein intake (animal or plant source), fruits, and vegetables.  -Sticking to a routine mealtime to eat 3 meals a day and avoiding unnecessary snacks is shown to have a big role in weight control. Under normal circumstances, the only time we lose real weight is when we are hungry, so allow hunger to take place- hunger means no food between meal times, only water.  It is not advisable to starve.   -It is better to avoid simple carbohydrates including: Cakes, Sweet Desserts, Ice Cream, Soda (diet and regular), Sweet Tea, Candies, Chips, Cookies, Store Bought Juices, Alcohol in Excess of  1-2 drinks a day, Artificial Sweeteners, Doughnuts, Coffee Creamers, "Sugar-free" Products, etc, etc.  This is not a complete list.....    -Consulting with certified diabetes educators is proven to provide you with the most accurate and current information on diet.  Also, you may be  interested in discussing diet options/exchanges , we can schedule a visit with Dana Lynch, RDN, CDE for individualized nutrition education.  -Exercise: If you are able: 30 -60 minutes a day ,4 days a week, or 150 minutes a week.  The longer the better.  Combine stretch, strength, and aerobic activities.  If you were told in the past that you  have high risk for cardiovascular diseases, you may seek evaluation by your heart doctor prior to initiating moderate to intense exercise programs.                                  Additional Care Considerations for Diabetes   -Diabetes  is a chronic disease.  The most important care consideration is regular follow-up with your diabetes care provider with the goal being avoiding or delaying its complications and to take advantage of advances in medications and technology.    -Type 2 diabetes is known to coexist with other important comorbidities such as high blood pressure and high cholesterol.  It is critical to control not only the diabetes but also the high blood pressure and high cholesterol to minimize and delay the risk of complications including coronary artery disease, stroke, amputations, blindness, etc.    - Studies showed that people with diabetes will benefit from a class of medications known as ACE inhibitors and statins.  Unless there are specific reasons not to be on these medications, the standard of care is to consider getting one from these groups of medications at an optimal doses.  These medications are generally considered safe and proven to help protect the heart and the kidneys.    - People with diabetes are encouraged to initiate and maintain regular follow-up with eye doctors, foot doctors, dentists ,   and if necessary heart and kidney doctors.     - It is highly recommended that people with diabetes quit smoking or stay away from smoking, and get yearly  flu vaccine and pneumonia vaccine at least every 5 years.  One other important lifestyle recommendation is to ensure adequate sleep - at least 7 hours of uninterrupted sleep at night.  -Exercise: If you are able: 30 -60 minutes a day, 4 days a week, or 150 minutes a week.  The longer the better.  Combine stretch, strength, and aerobic activities.  If you were told in the past that you have high risk for cardiovascular  diseases, you may seek evaluation by your heart doctor prior to initiating moderate to intense exercise programs.          

## 2019-03-12 NOTE — Progress Notes (Signed)
03/13/2019  Endocrinology follow-up note    Subjective:    Patient ID: Dana Lynch, female    DOB: 01-08-1971. Patient is being seen in follow up for management of diabetes requested by  Sinda Du, MD  Past Medical History:  Diagnosis Date  . Bowel habit changes 12/22/2013   Alternates constipation and diarrhea. Will refer to GI   . Diabetes ([place]) 02/16/2014  . Fibroids 12/22/2013  . Hypertension   . Unspecified symptom associated with female genital organs 12/22/2013   Past Surgical History:  Procedure Laterality Date  . APPENDECTOMY    . BREAST BIOPSY    . ENDOMETRIAL ABLATION    . TONSILLECTOMY AND ADENOIDECTOMY     Social History   Socioeconomic History  . Marital status: Married    Spouse name: Not on file  . Number of children: Not on file  . Years of education: Not on file  . Highest education level: Not on file  Occupational History  . Not on file  Social Needs  . Financial resource strain: Not on file  . Food insecurity    Worry: Not on file    Inability: Not on file  . Transportation needs    Medical: Not on file    Non-medical: Not on file  Tobacco Use  . Smoking status: Never Smoker  . Smokeless tobacco: Never Used  Substance and Sexual Activity  . Alcohol use: Yes    Comment: occ  . Drug use: No  . Sexual activity: Yes    Birth control/protection: Surgical    Comment: ablation  Lifestyle  . Physical activity    Days per week: Not on file    Minutes per session: Not on file  . Stress: Not on file  Relationships  . Social Herbalist on phone: Not on file    Gets together: Not on file    Attends religious service: Not on file    Active member of club or organization: Not on file    Attends meetings of clubs or organizations: Not on file    Relationship status: Not on file  Other Topics Concern  . Not on file  Social History Narrative  . Not on file   Outpatient Encounter Medications as of 03/12/2019  Medication Sig  .  blood glucose meter kit and supplies KIT Dispense based on patient and insurance preference. Use up to four times daily as directed. (FOR ICD-10 E11.65)  . Blood Glucose Monitoring Suppl (ONETOUCH VERIO) w/Device KIT 1 each by Does not apply route as needed.  . Cholecalciferol 5000 units capsule Take 1 capsule (5,000 Units total) by mouth daily.  Marland Kitchen glipiZIDE (GLUCOTROL XL) 5 MG 24 hr tablet Take 1 tablet (5 mg total) by mouth daily with breakfast.  . lisinopril (PRINIVIL,ZESTRIL) 5 MG tablet TAKE 1 TABLET(5 MG) BY MOUTH DAILY  . metFORMIN (GLUCOPHAGE XR) 500 MG 24 hr tablet Take 1 tablet (500 mg total) by mouth 2 (two) times daily after a meal.  . simvastatin (ZOCOR) 20 MG tablet TAKE 1 TABLET(20 MG) BY MOUTH DAILY  . [DISCONTINUED] glucose blood (ONETOUCH VERIO) test strip Use as instructed   No facility-administered encounter medications on file as of 03/12/2019.    ALLERGIES: No Known Allergies VACCINATION STATUS:  There is no immunization history on file for this patient.  Diabetes She presents for her follow-up diabetic visit. She has type 2 diabetes mellitus. Onset time: She was diagnosed at approximate age of 48 years. Her  disease course has been worsening. There are no hypoglycemic associated symptoms. Pertinent negatives for hypoglycemia include no confusion, headaches, pallor or seizures. Pertinent negatives for diabetes include no blurred vision, no chest pain, no fatigue, no polydipsia, no polyphagia and no polyuria. There are no hypoglycemic complications. Symptoms are worsening. There are no diabetic complications. Risk factors for coronary artery disease include diabetes mellitus, dyslipidemia, family history, obesity, hypertension and sedentary lifestyle. Current diabetic treatment includes oral agent (monotherapy) (She is on metformin 500 mg by mouth daily.). Her weight is decreasing steadily. She is following a generally unhealthy diet. When asked about meal planning, she reported  none. She has not had a previous visit with a dietitian. She rarely participates in exercise. (She does not monitor her blood glucose regularly.  She presents with A1c extremely high at greater than 14% changing from 7%.) An ACE inhibitor/angiotensin II receptor blocker is being taken.  Hyperlipidemia This is a chronic problem. The current episode started more than 1 year ago. The problem is uncontrolled. Exacerbating diseases include diabetes and obesity. Pertinent negatives include no chest pain, myalgias or shortness of breath. Current antihyperlipidemic treatment includes statins. Risk factors for coronary artery disease include dyslipidemia, family history, diabetes mellitus, obesity, hypertension and a sedentary lifestyle.  Hypertension This is a chronic problem. The current episode started more than 1 year ago. The problem is controlled. Pertinent negatives include no blurred vision, chest pain, headaches, palpitations or shortness of breath. Risk factors for coronary artery disease include diabetes mellitus, dyslipidemia, family history, obesity and sedentary lifestyle. Past treatments include ACE inhibitors.    Review of Systems  Constitutional: Negative for chills, fatigue, fever and unexpected weight change.  HENT: Negative for trouble swallowing and voice change.   Eyes: Negative for blurred vision and visual disturbance.  Respiratory: Negative for cough, shortness of breath and wheezing.   Cardiovascular: Negative for chest pain, palpitations and leg swelling.  Gastrointestinal: Negative for diarrhea, nausea and vomiting.  Endocrine: Negative for cold intolerance, heat intolerance, polydipsia, polyphagia and polyuria.  Musculoskeletal: Negative for arthralgias and myalgias.  Skin: Negative for color change, pallor, rash and wound.  Neurological: Negative for seizures and headaches.  Psychiatric/Behavioral: Negative for confusion and suicidal ideas.    Objective:    BP 118/80    Pulse 90   Ht 5' 5" (1.651 m)   Wt 213 lb (96.6 kg)   BMI 35.45 kg/m   Wt Readings from Last 3 Encounters:  03/12/19 213 lb (96.6 kg)  03/03/19 208 lb (94.3 kg)  06/26/18 230 lb (104.3 kg)    Physical Exam  Constitutional: She is oriented to person, place, and time. She appears well-developed.  HENT:  Head: Normocephalic and atraumatic.  Eyes: EOM are normal.  Neck: Normal range of motion. Neck supple. No tracheal deviation present. No thyromegaly present.  Cardiovascular: Normal rate and regular rhythm.  Pulmonary/Chest: Effort normal and breath sounds normal.  Abdominal: Soft. Bowel sounds are normal. There is no abdominal tenderness. There is no guarding.  Musculoskeletal: Normal range of motion.        General: No edema.  Neurological: She is alert and oriented to person, place, and time. She has normal reflexes. No cranial nerve deficit. Coordination normal.  Skin: Skin is warm and dry. No rash noted. No erythema. No pallor.  Psychiatric: She has a normal mood and affect. Judgment normal.     CMP     Component Value Date/Time   NA 136 03/05/2019 0733   NA 139 05/21/2017  1208   K 4.0 03/05/2019 0733   CL 102 03/05/2019 0733   CO2 26 03/05/2019 0733   GLUCOSE 390 (H) 03/05/2019 0733   BUN 10 03/05/2019 0733   BUN 15 05/21/2017 1208   CREATININE 0.63 03/05/2019 0733   CALCIUM 9.4 03/05/2019 0733   PROT 6.5 03/05/2019 0733   PROT 7.0 01/19/2017 0955   ALBUMIN 5.0 05/21/2017 1208   AST 15 03/05/2019 0733   ALT 31 (H) 03/05/2019 0733   ALKPHOS 107 01/19/2017 0955   BILITOT 0.6 03/05/2019 0733   BILITOT 0.5 01/19/2017 0955   GFRNONAA 105 03/05/2019 0733   GFRAA 121 03/05/2019 0733     Diabetic Labs (most recent): Lab Results  Component Value Date   HGBA1C >14.0 (H) 03/05/2019   HGBA1C 7.0 (H) 05/06/2018   HGBA1C 6.5 (H) 10/01/2017     Lipid Panel ( most recent) Lipid Panel     Component Value Date/Time   CHOL 194 01/19/2017 0955   TRIG 282 (H)  01/19/2017 0955   HDL 33 (L) 01/19/2017 0955   CHOLHDL 5.9 (H) 01/19/2017 0955   CHOLHDL 4.7 04/20/2014 0915   VLDL 14 04/20/2014 0915   LDLCALC 105 (H) 01/19/2017 0955    Assessment & Plan:   1. Uncontrolled type 2 diabetes mellitus without complication, without long-term current use of insulin ([place])  - Patient has currently controlled asymptomatic type 2 DM since 48 years of age. - She came with loss of control of diabetes with A1c of  >14% increasing from 7%.    - Recent labs reviewed.   She does not report gross complications from diabetes, however she remains at a high risk for more acute and chronic complications which include CAD, CVA, CKD, retinopathy, and neuropathy. These are all discussed in detail with the patient.  - I have counseled the patient on diet management and weight loss, by adopting a carbohydrate restricted/protein rich diet.  - she  admits there is a room for improvement in her diet and drink choices. -  Suggestion is made for her to avoid simple carbohydrates  from her diet including Cakes, Sweet Desserts / Pastries, Ice Cream, Soda (diet and regular), Sweet Tea, Candies, Chips, Cookies, Sweet Pastries,  Store Bought Juices, Alcohol in Excess of  1-2 drinks a day, Artificial Sweeteners, Coffee Creamer, and "Sugar-free" Products. This will help patient to have stable blood glucose profile and potentially avoid unintended weight gain.   - I encouraged the patient to switch to  unprocessed or minimally processed complex starch and increased protein intake (animal or plant source), fruits, and vegetables.  - Patient is advised to stick to a routine mealtimes to eat 3 meals  a day and avoid unnecessary snacks ( to snack only to correct hypoglycemia).   - I have approached patient with the following individualized plan to manage diabetes and patient agrees:   - Based on her presentation with loss of control, she was approached for insulin treatment, however she  declined this offer.   She minimizes her symptoms of hypoglycemia including no weight loss which most likely happened due to catabolic state in the setting of recent weight loss.   -She is willing to start monitoring blood glucose, advised her to start monitoring 4 times a day and will have a phone visit in 7 days with her readings.  -In the meantime, she is advised to continue metformin 500 mg XR twice daily after breakfast and supper.  -She also accepts a prescription for glipizide  5 mg XL p.o. daily at breakfast  - Patient will be considered for incretin therapy as appropriate next visit if she does not achieve control. - Patient specific target  A1c;  LDL, HDL, Triglycerides, and  Waist Circumference were discussed in detail.  2) BP/HTN: Her blood pressure is controlled to target.  Continue current medications including ACEI/ARB. 3) Lipids/HPL:   Her lipid panel shows uncontrolled LDL at 105,   hypertriglyceridemia of 280.   She is advised to continue simvastatin 20 mg p.o. nightly.   Better control of diabetes will help with triglycerides as well, will have fasting lipid panel before her next visit.  4)  Weight/Diet: CDE Consult has been requested , exercise, and detailed carbohydrates information provided.  5) Chronic Care/Health Maintenance:  -Patient is on ACEI/ARB and Statin medications and encouraged to continue to follow up with Ophthalmology, Podiatrist at least yearly or according to recommendations, and advised to  stay away from smoking. I have recommended yearly flu vaccine and pneumonia vaccination at least every 5 years; moderate intensity exercise for up to 150 minutes weekly; and  sleep for at least 7 hours a day.  - I advised patient to maintain close follow up with Sinda Du, MD for primary care needs.  - Patient Care Time Today:  25 min, of which >50% was spent in  counseling and the rest reviewing her  current and  previous labs/studies, previous treatments, her  blood glucose readings, and medications' doses and developing a plan for long-term care based on the latest recommendations for standards of care.   [name] participated in the discussions, expressed understanding, and voiced agreement with the above plans.  All questions were answered to her satisfaction. she is encouraged to contact clinic should she have any questions or concerns prior to her return visit.  Follow up plan: - Return in about 1 week (around 03/19/2019) for Follow up with Meter and Logs Only - no Labs.  Glade Lloyd, MD Phone: [phone]  Fax: [phone]  -  This note was partially dictated with voice recognition software. Similar sounding words can be transcribed inadequately or may not  be corrected upon review.  03/12/2019, 6:01 PM

## 2019-03-13 ENCOUNTER — Telehealth: Payer: Self-pay | Admitting: "Endocrinology

## 2019-03-13 MED ORDER — BLOOD GLUCOSE MONITOR KIT: PACK | 5 refills | Status: AC

## 2019-03-13 NOTE — Telephone Encounter (Signed)
Rx sent. Pts insurance does not cover one touch. Sent prescription for new meter, strips and lancets that insurance will cover.

## 2019-03-13 NOTE — Telephone Encounter (Signed)
Pt needs a script for the meter and lancets. Pharmacy states they did not receive it. [place]

## 2019-03-18 ENCOUNTER — Telehealth: Payer: Self-pay | Admitting: Obstetrics and Gynecology

## 2019-03-18 NOTE — Telephone Encounter (Signed)
U/s shows 577 gram uterus, with a 9 cm x 9 cm x 7.5 cm fibroid (softball sized) . Ovaries are normal. Pt left a message with description of u/s . Pt asked to call office for any questions.

## 2019-03-20 ENCOUNTER — Ambulatory Visit: Payer: BC Managed Care – PPO | Admitting: "Endocrinology

## 2019-03-21 ENCOUNTER — Other Ambulatory Visit: Payer: Self-pay

## 2019-03-21 ENCOUNTER — Encounter: Payer: Self-pay | Admitting: "Endocrinology

## 2019-03-21 ENCOUNTER — Ambulatory Visit (INDEPENDENT_AMBULATORY_CARE_PROVIDER_SITE_OTHER): Payer: BC Managed Care – PPO | Admitting: "Endocrinology

## 2019-03-21 DIAGNOSIS — E782 Mixed hyperlipidemia: Secondary | ICD-10-CM

## 2019-03-21 DIAGNOSIS — I10 Essential (primary) hypertension: Secondary | ICD-10-CM

## 2019-03-21 DIAGNOSIS — E1165 Type 2 diabetes mellitus with hyperglycemia: Secondary | ICD-10-CM | POA: Diagnosis not present

## 2019-03-21 MED ORDER — GLIPIZIDE ER 10 MG PO TB24: 10.0000 mg | ORAL_TABLET | Freq: Every day | ORAL | 3 refills | Status: DC

## 2019-03-21 NOTE — Progress Notes (Signed)
03/21/2019                                                     Endocrinology Telehealth Visit Follow up Note -During COVID -19 Pandemic  This visit type was conducted due to national recommendations for restrictions regarding the COVID-19 Pandemic  in an effort to limit this patient's exposure and mitigate transmission of the corona virus.  Due to her co-morbid illnesses, Dana Lynch is at  moderate to high risk for complications without adequate follow up.  This format is felt to be most appropriate for her at this time.  I connected with this patient on 03/21/2019   by telephone and verified that I am speaking with the correct person using two identifiers. Dana Lynch, [birth year]0. she has verbally consented to this visit. All issues noted in this document were discussed and addressed. The format was not optimal for physical exam.    Subjective:    Patient ID: Dana Lynch, female    DOB: 04-Jun-1971. Patient is being engaged in telehealth via telephone in follow up for management of diabetes requested by  Sinda Du, MD  Past Medical History:  Diagnosis Date  . Bowel habit changes 12/22/2013   Alternates constipation and diarrhea. Will refer to GI   . Diabetes ([place]) 02/16/2014  . Fibroids 12/22/2013  . Hypertension   . Unspecified symptom associated with female genital organs 12/22/2013   Past Surgical History:  Procedure Laterality Date  . APPENDECTOMY    . BREAST BIOPSY    . ENDOMETRIAL ABLATION    . TONSILLECTOMY AND ADENOIDECTOMY     Social History   Socioeconomic History  . Marital status: Married    Spouse name: Not on file  . Number of children: Not on file  . Years of education: Not on file  . Highest education level: Not on file  Occupational History  . Not on file  Social Needs  . Financial resource strain: Not on file  . Food insecurity    Worry: Not on file    Inability: Not on file  . Transportation needs    Medical: Not on file   Non-medical: Not on file  Tobacco Use  . Smoking status: Never Smoker  . Smokeless tobacco: Never Used  Substance and Sexual Activity  . Alcohol use: Yes    Comment: occ  . Drug use: No  . Sexual activity: Yes    Birth control/protection: Surgical    Comment: ablation  Lifestyle  . Physical activity    Days per week: Not on file    Minutes per session: Not on file  . Stress: Not on file  Relationships  . Social Herbalist on phone: Not on file    Gets together: Not on file    Attends religious service: Not on file    Active member of club or organization: Not on file    Attends meetings of clubs or organizations: Not on file    Relationship status: Not on file  Other Topics Concern  . Not on file  Social History Narrative  . Not on file   Outpatient Encounter Medications as of 03/21/2019  Medication Sig  . blood glucose meter kit and supplies KIT Dispense based on patient and insurance preference. Use up to four times daily as  directed. (FOR ICD-10 E11.65)  . Blood Glucose Monitoring Suppl (ONETOUCH VERIO) w/Device KIT 1 each by Does not apply route as needed.  . Cholecalciferol 5000 units capsule Take 1 capsule (5,000 Units total) by mouth daily.  Marland Kitchen glipiZIDE (GLUCOTROL XL) 10 MG 24 hr tablet Take 1 tablet (10 mg total) by mouth daily with breakfast.  . glucose blood (ONETOUCH VERIO) test strip Use as instructed  . lisinopril (PRINIVIL,ZESTRIL) 5 MG tablet TAKE 1 TABLET(5 MG) BY MOUTH DAILY  . metFORMIN (GLUCOPHAGE XR) 500 MG 24 hr tablet Take 1 tablet (500 mg total) by mouth 2 (two) times daily after a meal.  . simvastatin (ZOCOR) 20 MG tablet TAKE 1 TABLET(20 MG) BY MOUTH DAILY  . [DISCONTINUED] glipiZIDE (GLUCOTROL XL) 5 MG 24 hr tablet Take 1 tablet (5 mg total) by mouth daily with breakfast.   No facility-administered encounter medications on file as of 03/21/2019.    ALLERGIES: No Known Allergies VACCINATION STATUS:  There is no immunization history on  file for this patient.  Diabetes She presents for her follow-up diabetic visit. She has type 2 diabetes mellitus. Onset time: She was diagnosed at approximate age of 48 years. Her disease course has been improving. There are no hypoglycemic associated symptoms. Pertinent negatives for hypoglycemia include no confusion, headaches, pallor or seizures. Pertinent negatives for diabetes include no blurred vision, no chest pain, no fatigue, no polydipsia, no polyphagia and no polyuria. There are no hypoglycemic complications. Symptoms are improving. There are no diabetic complications. Risk factors for coronary artery disease include diabetes mellitus, dyslipidemia, family history, obesity, hypertension and sedentary lifestyle. Current diabetic treatment includes oral agent (monotherapy) (She is on metformin 500 mg by mouth daily.). Her weight is decreasing steadily. She is following a generally unhealthy diet. When asked about meal planning, she reported none. She has not had a previous visit with a dietitian. She rarely participates in exercise. Her breakfast blood glucose range is generally 180-200 mg/dl. Her lunch blood glucose range is generally 180-200 mg/dl. Her dinner blood glucose range is generally 180-200 mg/dl. Her bedtime blood glucose range is generally 180-200 mg/dl. Her overall blood glucose range is 180-200 mg/dl. An ACE inhibitor/angiotensin II receptor blocker is being taken.  Hyperlipidemia This is a chronic problem. The current episode started more than 1 year ago. The problem is uncontrolled. Exacerbating diseases include diabetes and obesity. Pertinent negatives include no chest pain, myalgias or shortness of breath. Current antihyperlipidemic treatment includes statins. Risk factors for coronary artery disease include dyslipidemia, family history, diabetes mellitus, obesity, hypertension and a sedentary lifestyle.  Hypertension This is a chronic problem. The current episode started more than  1 year ago. The problem is controlled. Pertinent negatives include no blurred vision, chest pain, headaches, palpitations or shortness of breath. Risk factors for coronary artery disease include diabetes mellitus, dyslipidemia, family history, obesity and sedentary lifestyle. Past treatments include ACE inhibitors.   Review of systems: Limited as above  Objective:    There were no vitals taken for this visit.  Wt Readings from Last 3 Encounters:  03/12/19 213 lb (96.6 kg)  03/03/19 208 lb (94.3 kg)  06/26/18 230 lb (104.3 kg)      CMP     Component Value Date/Time   NA 136 03/05/2019 0733   NA 139 05/21/2017 1208   K 4.0 03/05/2019 0733   CL 102 03/05/2019 0733   CO2 26 03/05/2019 0733   GLUCOSE 390 (H) 03/05/2019 0733   BUN 10 03/05/2019 7673  BUN 15 05/21/2017 1208   CREATININE 0.63 03/05/2019 0733   CALCIUM 9.4 03/05/2019 0733   PROT 6.5 03/05/2019 0733   PROT 7.0 01/19/2017 0955   ALBUMIN 5.0 05/21/2017 1208   AST 15 03/05/2019 0733   ALT 31 (H) 03/05/2019 0733   ALKPHOS 107 01/19/2017 0955   BILITOT 0.6 03/05/2019 0733   BILITOT 0.5 01/19/2017 0955   GFRNONAA 105 03/05/2019 0733   GFRAA 121 03/05/2019 0733     Diabetic Labs (most recent): Lab Results  Component Value Date   HGBA1C >14.0 (H) 03/05/2019   HGBA1C 7.0 (H) 05/06/2018   HGBA1C 6.5 (H) 10/01/2017     Lipid Panel ( most recent) Lipid Panel     Component Value Date/Time   CHOL 194 01/19/2017 0955   TRIG 282 (H) 01/19/2017 0955   HDL 33 (L) 01/19/2017 0955   CHOLHDL 5.9 (H) 01/19/2017 0955   CHOLHDL 4.7 04/20/2014 0915   VLDL 14 04/20/2014 0915   LDLCALC 105 (H) 01/19/2017 0955    Assessment & Plan:   1. Uncontrolled type 2 diabetes mellitus without complication, without long-term current use of insulin ([place])  - Patient has currently controlled asymptomatic type 2 DM since 48 years of age. - She has responded to addition of glipizide to her metformin.  Her recent A1c was  >14% increasing  from 7%.  She refused initiation of insulin treatment.  - Recent labs reviewed.   She does not report gross complications from diabetes, however she remains at a high risk for more acute and chronic complications which include CAD, CVA, CKD, retinopathy, and neuropathy. These are all discussed in detail with the patient.  - I have counseled the patient on diet management and weight loss, by adopting a carbohydrate restricted/protein rich diet. - she  admits there is a room for improvement in her diet and drink choices. -  Suggestion is made for her to avoid simple carbohydrates  from her diet including Cakes, Sweet Desserts / Pastries, Ice Cream, Soda (diet and regular), Sweet Tea, Candies, Chips, Cookies, Sweet Pastries,  Store Bought Juices, Alcohol in Excess of  1-2 drinks a day, Artificial Sweeteners, Coffee Creamer, and "Sugar-free" Products. This will help patient to have stable blood glucose profile and potentially avoid unintended weight gain.   - I encouraged the patient to switch to  unprocessed or minimally processed complex starch and increased protein intake (animal or plant source), fruits, and vegetables.  - Patient is advised to stick to a routine mealtimes to eat 3 meals  a day and avoid unnecessary snacks ( to snack only to correct hypoglycemia).   - I have approached patient with the following individualized plan to manage diabetes and patient agrees:   - Based on her presentation with loss of control, she was recently approached for insulin treatment, however she declined this offer.   She minimizes her symptoms of hypoglycemia including no weight loss which most likely happened due to catabolic state in the setting of recent weight loss.   -She has responded to addition of glipizide with slight improvement in her glycemic profile.  She agrees to increase glipizide to 10 mg XL p.o. daily at breakfast.  She is advised to continue metformin 500 mg XR twice daily after breakfast  and supper.  She is willing to continue to monitor blood glucose twice a day- daily before breakfast and at bedtime.    - Patient will be considered for incretin therapy as appropriate next visit if she does  not achieve control. - Patient specific target  A1c;  LDL, HDL, Triglycerides, and  Waist Circumference were discussed in detail.  2) BP/HTN: she is advised to home monitor blood pressure and report if > 140/90 on 2 separate readings.  Continue current medications including ACEI/ARB. 3) Lipids/HPL:   Her lipid panel shows uncontrolled LDL at 105,   hypertriglyceridemia of 280.   She is advised to continue simvastatin 20 mg p.o. nightly     Better control of diabetes will help with triglycerides as well, will have fasting lipid panel before her next visit.  4)  Weight/Diet: CDE Consult has been requested , exercise, and detailed carbohydrates information provided.  5) Chronic Care/Health Maintenance:  -Patient is on ACEI/ARB and Statin medications and encouraged to continue to follow up with Ophthalmology, Podiatrist at least yearly or according to recommendations, and advised to  stay away from smoking. I have recommended yearly flu vaccine and pneumonia vaccination at least every 5 years; moderate intensity exercise for up to 150 minutes weekly; and  sleep for at least 7 hours a day.  - I advised patient to maintain close follow up with Sinda Du, MD for primary care needs.  - Patient Care Time Today:  25 min, of which >50% was spent in  counseling and the rest reviewing her  current and  previous labs/studies, previous treatments, her blood glucose readings, and medications' doses and developing a plan for long-term care based on the latest recommendations for standards of care.   Dana Lynch participated in the discussions, expressed understanding, and voiced agreement with the above plans.  All questions were answered to her satisfaction. she is encouraged to contact clinic  should she have any questions or concerns prior to her return visit.   Follow up plan: - Return in about 3 months (around 06/20/2019) for Bring Meter and Logs- A1c in Office, Include 8 log sheets.  Glade Lloyd, MD Phone: [phone]  Fax: [phone]  -  This note was partially dictated with voice recognition software. Similar sounding words can be transcribed inadequately or may not  be corrected upon review.  03/21/2019, 12:55 PM

## 2019-03-24 ENCOUNTER — Telehealth: Payer: Self-pay | Admitting: Obstetrics and Gynecology

## 2019-03-24 NOTE — Telephone Encounter (Signed)
Called patient regarding appointment and the following message was left:   We have you scheduled for an upcoming appointment at our office. At this time, patients are encouraged to come alone to their visits whenever possible, however, a support person, over age 50, may accompany you to your appointment if assistance is needed for safety or care concerns. Otherwise, support persons should remain outside until the visit is complete.   We ask if you have had any exposure to anyone suspected or confirmed of having COVID-19 or if you are experiencing any of the following, to call and reschedule your appointment: fever, cough, shortness of breath, muscle pain, diarrhea, rash, vomiting, abdominal pain, red eye, weakness, bruising, bleeding, joint pain, or a severe headache.   Please know we will ask you these questions or similar questions when you arrive for your appointment and again its how we are keeping everyone safe.    Also,to keep you safe, please use the provided hand sanitizer when you enter the office. We are asking everyone in the office to wear a mask to help prevent the spread of germs. If you have a mask of your own, please wear it to your appointment, if not, we are happy to provide one for you.  Thank you for understanding and your cooperation.    [facility] Staff

## 2019-03-25 ENCOUNTER — Ambulatory Visit: Payer: BC Managed Care – PPO | Admitting: Obstetrics and Gynecology

## 2019-03-25 ENCOUNTER — Encounter: Payer: Self-pay | Admitting: Obstetrics and Gynecology

## 2019-03-25 ENCOUNTER — Other Ambulatory Visit: Payer: Self-pay

## 2019-03-25 VITALS — BP 107/72 | HR 89 | Ht 68.0 in | Wt 211.2 lb

## 2019-03-25 DIAGNOSIS — D219 Benign neoplasm of connective and other soft tissue, unspecified: Secondary | ICD-10-CM

## 2019-03-25 NOTE — Progress Notes (Signed)
[facility] Visit  _0 @            Patient name: Dana Lynch MRN [id number]  Date of birth: 07/03/1971  CC & HPI:  Dana Lynch is a 48 y.o. female presenting today for I review of her recent pelvic ultrasound findings.  She had an ultrasound 5 years ago that showed approximately a 450 g uterus that was relatively asymptomatic.  There was no ovarian pathology noted at that time.  Today's ultrasound discussion shows that the uterus is enlarged to 677 g estimated weight at this time due to the predominantly single large fibroid GYNECOLOGIC SONOGRAM   Dana Lynch is a 48 y.o. G3P3 s/p ablation,she is here for a pelvic sonogram for enlarged uterus.  Uterus                      13.7 x 9.1 x 10.4 cm, Total uterine volume 677 cc,enlarged heterogeneous anteverted uterus with a 9.2 x 9.4 x 7.5 cm fibroid mid uterus  Endometrium          Unable to visualize endometrium because of fibroid   Right ovary             2.8 x 1.8 x 2.7 cm, wnl,limited view   Left ovary                2.5 x 3.7 x 2.2 cm, wnl,appears to be mobile   No free fluid   Technician Comments:  PELVIC US TA/TV: enlarged heterogeneous anteverted uterus with a 9.2 x 9.4 x 7.5 cm fibroid mid uterus,unable to visualize the endometrium,normal ovaries bilat,limited view of right ovary,no free fluid,no pain during ultrasound   Chaperone: Dianne Dun 03/10/2019 5:00 PM  Clinical Impression and recommendations:  I have reviewed the sonogram results above, combined with the patient's current clinical course, below are my impressions and any appropriate recommendations for management based on the sonographic findings.  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  The patient states that she is somewhat symptomatic with the pressure effect and has to go to the bathroom "all the time" she is leaning towards some sort of intervention either embolization or hysterectomy.  We brought up the idea of leaving the cervix, pros and cons discussed, as well as pros and cons of ovarian removal.  She is to go home discussed with significant other and return for discussion or let us know by phone of her desire.  If she needs to proceed toward hysterectomy she will need a preoperative visit and scheduling of abdominal hysterectomy with or without salpingo-oophorectomy  ROS:  ROS   Pertinent History Reviewed:   Reviewed: Significant for symptomatic urinary frequency attributable to the fibroid enlargement.  She is an Tourist information centre manager, teaching remotely at present and may lend herself toward proceeding for surgery promptly Medical         Past Medical History:  Diagnosis Date  . Bowel habit changes 12/22/2013   Alternates constipation and diarrhea. Will refer to GI   . Diabetes ([place]) 02/16/2014  . Fibroids 12/22/2013  . Hypertension   . Unspecified symptom associated with female  genital organs 12/22/2013                              Surgical Hx:    Past Surgical History:  Procedure Laterality Date  . APPENDECTOMY    . BREAST BIOPSY    . ENDOMETRIAL ABLATION    . TONSILLECTOMY AND ADENOIDECTOMY     Medications: Reviewed & Updated - see associated section                       Current Outpatient Medications:  .  blood glucose meter kit and supplies KIT, Dispense based on patient and insurance preference. Use up to four times daily as directed. (FOR ICD-10 E11.65), Disp: 1 each, Rfl: 5 .  Blood Glucose Monitoring Suppl (ONETOUCH VERIO) w/Device KIT, 1 each by Does not apply route as needed., Disp: 1 kit, Rfl: 0 .  Cholecalciferol 5000  units capsule, Take 1 capsule (5,000 Units total) by mouth daily. (Patient taking differently: Take 5,000 Units by mouth. ), Disp: , Rfl:  .  glipiZIDE (GLUCOTROL XL) 10 MG 24 hr tablet, Take 1 tablet (10 mg total) by mouth daily with breakfast., Disp: 30 tablet, Rfl: 3 .  glucose blood (ONETOUCH VERIO) test strip, Use as instructed, Disp: 150 each, Rfl: 2 .  lisinopril (PRINIVIL,ZESTRIL) 5 MG tablet, TAKE 1 TABLET(5 MG) BY MOUTH DAILY, Disp: 30 tablet, Rfl: 11 .  metFORMIN (GLUCOPHAGE XR) 500 MG 24 hr tablet, Take 1 tablet (500 mg total) by mouth 2 (two) times daily after a meal., Disp: 180 tablet, Rfl: 1 .  simvastatin (ZOCOR) 20 MG tablet, TAKE 1 TABLET(20 MG) BY MOUTH DAILY, Disp: 30 tablet, Rfl: 3   Social History: Reviewed -  reports that she has never smoked. She has never used smokeless tobacco.  Objective Findings:  Vitals: Blood pressure 107/72, pulse 89, height _0  (1.727 m), weight 211 lb 3.2 oz (95.8 kg).  PHYSICAL EXAMINATION General appearance - alert, well appearing, and in no distress, oriented to person, place, and time and overweight Mental status - normal mood, behavior, speech, dress, motor activity, and thought processes Chest -  Heart - normal rate and regular rhythm Abdomen -  Breasts -  Skin -  Discussion only at this time  Assessment & Plan:   A:  1. Fibroid enlargement, 670 g estimated uterine weight, due to symptomatic fibroid  P:  1. Options to be discussed by patient she will let us know if she wants to proceed with hysterectomy or referral for further information on embolization    .[email or web address]

## 2019-04-01 ENCOUNTER — Telehealth: Payer: Self-pay | Admitting: Obstetrics and Gynecology

## 2019-04-01 NOTE — Telephone Encounter (Signed)
Called patient regarding appointment and the following message was left:   We have you scheduled for an upcoming appointment at our office. At this time, patients are encouraged to come alone to their visits whenever possible, however, a support person, over age 50, may accompany you to your appointment if assistance is needed for safety or care concerns. Otherwise, support persons should remain outside until the visit is complete.   We ask if you have had any exposure to anyone suspected or confirmed of having COVID-19 or if you are experiencing any of the following, to call and reschedule your appointment: fever, cough, shortness of breath, muscle pain, diarrhea, rash, vomiting, abdominal pain, red eye, weakness, bruising, bleeding, joint pain, or a severe headache.   Please know we will ask you these questions or similar questions when you arrive for your appointment and again its how we are keeping everyone safe.    Also,to keep you safe, please use the provided hand sanitizer when you enter the office. We are asking everyone in the office to wear a mask to help prevent the spread of germs. If you have a mask of your own, please wear it to your appointment, if not, we are happy to provide one for you.  Thank you for understanding and your cooperation.    [facility] Staff

## 2019-04-02 ENCOUNTER — Other Ambulatory Visit: Payer: Self-pay

## 2019-04-02 ENCOUNTER — Encounter: Payer: Self-pay | Admitting: Obstetrics and Gynecology

## 2019-04-02 ENCOUNTER — Ambulatory Visit: Payer: BC Managed Care – PPO | Admitting: Obstetrics and Gynecology

## 2019-04-02 VITALS — BP 111/68 | HR 87 | Ht 68.0 in | Wt 210.8 lb

## 2019-04-02 DIAGNOSIS — D219 Benign neoplasm of connective and other soft tissue, unspecified: Secondary | ICD-10-CM

## 2019-04-02 NOTE — Progress Notes (Addendum)
Patient ID: Dana Lynch, female   DOB: 08/31/1970, 48 y.o.   MRN: [id number]  Preoperative History and Physical  Dana Lynch is a 48 y.o. G3P3 here for surgical management of fibroid enlargement.   No significant preoperative concerns. We had a lengthy discussion of the various portions of hysterectomy to be decided upon.  She has had a normal pap, and is aware that if cervix is left in place, that future pap monitoring is recommended. She is aware of the slight cancer risk reduction associated with bilateral salpingectomy , as well as the slight increased risk of postop adhesions, and opts for leaving the fallopian tubes and ovaries in place, after discussion of lifetime rates of ovary/ tube cancers.  Proposed surgery: supracervical abdominal hysterectomy. w/o salpingectomy  Past Medical History:  Diagnosis Date  . Bowel habit changes 12/22/2013   Alternates constipation and diarrhea. Will refer to GI   . Diabetes ([place]) 02/16/2014  . Fibroids 12/22/2013  . Hypertension   . Unspecified symptom associated with female genital organs 12/22/2013   Past Surgical History:  Procedure Laterality Date  . APPENDECTOMY    . BREAST BIOPSY    . ENDOMETRIAL ABLATION    . TONSILLECTOMY AND ADENOIDECTOMY     OB History  Gravida Para Term Preterm AB Living  3 3       3   SAB TAB Ectopic Multiple Live Births               # Outcome Date GA Lbr Len/2nd Weight Sex Delivery Anes PTL Lv  3 Para           2 Para           1 Para           Patient denies any other pertinent gynecologic issues.   Current Outpatient Medications on File Prior to Visit  Medication Sig Dispense Refill  . blood glucose meter kit and supplies KIT Dispense based on patient and insurance preference. Use up to four times daily as directed. (FOR ICD-10 E11.65) 1 each 5  . Blood Glucose Monitoring Suppl (ONETOUCH VERIO) w/Device KIT 1 each by Does not apply route as needed. 1 kit 0  . Cholecalciferol 5000 units capsule  Take 1 capsule (5,000 Units total) by mouth daily. (Patient taking differently: Take 5,000 Units by mouth. )    . glipiZIDE (GLUCOTROL XL) 10 MG 24 hr tablet Take 1 tablet (10 mg total) by mouth daily with breakfast. 30 tablet 3  . glucose blood (ONETOUCH VERIO) test strip Use as instructed 150 each 2  . lisinopril (PRINIVIL,ZESTRIL) 5 MG tablet TAKE 1 TABLET(5 MG) BY MOUTH DAILY 30 tablet 11  . metFORMIN (GLUCOPHAGE XR) 500 MG 24 hr tablet Take 1 tablet (500 mg total) by mouth 2 (two) times daily after a meal. 180 tablet 1  . simvastatin (ZOCOR) 20 MG tablet TAKE 1 TABLET(20 MG) BY MOUTH DAILY 30 tablet 3   No current facility-administered medications on file prior to visit.    No Known Allergies  Social History:   reports that she has never smoked. She has never used smokeless tobacco. She reports current alcohol use. She reports that she does not use drugs.  Family History  Problem Relation Age of Onset  . Diabetes Mother   . Diabetes Maternal Grandmother   . Hypertension Maternal Grandmother   . Cancer Maternal Grandfather        prostate  . Heart disease Paternal Grandmother   .  Diabetes Paternal Grandmother   . Heart disease Paternal Grandfather     Review of Systems: Noncontributory  PHYSICAL EXAM: There were no vitals taken for this visit. General appearance - alert, well appearing, and in no distress Chest - clear to auscultation, no wheezes, rales or rhonchi, symmetric air entry Heart - normal rate and regular rhythm Abdomen - soft, nontender, nondistended, no masses or organomegaly Pelvic - VAGINA: normal appearing vagina  CERVIX: normal appearing cervix well supported UTERUS: mobile.  Extremities - peripheral pulses normal, no pedal edema, no clubbing or cyanosis  Labs: No results found for this or any previous visit (from the past 336 hour(s)).  Imaging Studies: US Transvaginal Non-ob  Result Date: 03/10/2019 GYNECOLOGIC SONOGRAM VALI Dana Lynch is a 48  y.o. G3P3 s/p ablation,she is here for a pelvic sonogram for enlarged uterus. Uterus                      13.7 x 9.1 x 10.4 cm, Total uterine volume 677 cc,enlarged heterogeneous anteverted uterus with a 9.2 x 9.4 x 7.5 cm fibroid mid uterus Endometrium          Unable to visualize endometrium because of fibroid Right ovary             2.8 x 1.8 x 2.7 cm, wnl,limited view Left ovary                2.5 x 3.7 x 2.2 cm, wnl,appears to be mobile No free fluid Technician Comments: PELVIC US TA/TV: enlarged heterogeneous anteverted uterus with a 9.2 x 9.4 x 7.5 cm fibroid mid uterus,unable to visualize the endometrium,normal ovaries bilat,limited view of right ovary,no free fluid,no pain during ultrasound Chaperone: Dianne Dun 03/10/2019 5:00 PM Clinical Impression and recommendations: I have reviewed the sonogram results above, combined with the patient's current clinical course, below are my impressions and any appropriate recommendations for management based on the sonographic findings. 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 G3P3 s/p ablation,she is here for a pelvic sonogram for enlarged uterus. Uterus                      13.7 x 9.1 x 10.4 cm, Total uterine volume 677 cc,enlarged heterogeneous anteverted uterus with a 9.2 x 9.4 x 7.5 cm fibroid mid uterus Endometrium          Unable to visualize endometrium because of fibroid Right ovary             2.8 x 1.8 x 2.7 cm, wnl,limited view Left ovary                2.5 x 3.7 x 2.2 cm, wnl,appears to be mobile No free fluid Technician Comments: PELVIC US TA/TV: enlarged heterogeneous anteverted uterus with a 9.2 x 9.4 x 7.5 cm fibroid mid uterus,unable to visualize the endometrium,normal  ovaries bilat,limited view of right ovary,no free fluid,no pain during ultrasound Chaperone: Dianne Dun 03/10/2019 5:00 PM Clinical Impression and recommendations: I have reviewed the sonogram results above, combined with the patient's current clinical course, below are my impressions and any appropriate recommendations for management based on the sonographic findings. 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 Essential hypertension, benign 02/09/2017  . Mixed hyperlipidemia 02/09/2017  . Class 2 severe obesity due to excess calories with serious comorbidity and body mass index (BMI) of 36.0 to 36.9 in adult ([place]) 02/09/2017  . Elevated cholesterol 01/26/2017  . Encounter for gynecological examination with Papanicolaou smear of cervix 01/26/2017  . Vitamin D deficiency 01/26/2017  . Uncontrolled type 2 diabetes mellitus without complication, without long-term current use of insulin ([place]) 02/16/2014  . Hypertension 01/14/2014  . Unspecified symptom associated with female genital organs 12/22/2013  . Fibroids 12/22/2013  . Bowel habit changes 12/22/2013    Plan: Patient will undergo surgical management with abdominal supracervical hysterectomy w/o salpingectomy.    By signing my name below, I, Samul Dada, attest that this documentation has been prepared under the direction and in the presence of Jonnie Kind, MD. Electronically Signed: [place]. 04/02/19. 3:32 PM.  I personally performed the services described in this documentation, which was SCRIBED in my presence. The recorded information has been reviewed and considered accurate. It has been edited as necessary during review. Jonnie Kind, MD

## 2019-04-07 ENCOUNTER — Telehealth: Payer: Self-pay | Admitting: Obstetrics and Gynecology

## 2019-04-07 NOTE — Telephone Encounter (Signed)
Patient presented to the office today, needs a note for work stating that she is having surgery on 04/22/19 and will be out of work starting 04/18/19 - 05/30/19.  Work will not give her the papers for work until they get this note.  She would like it emailed to [email or web address]  [phone]

## 2019-04-08 ENCOUNTER — Telehealth: Payer: Self-pay | Admitting: *Deleted

## 2019-04-08 ENCOUNTER — Encounter: Payer: Self-pay | Admitting: *Deleted

## 2019-04-08 NOTE — Telephone Encounter (Signed)
Patient informed note sent to email as requested. Hard copy placed on file as well.  Verbalized understanding.

## 2019-04-17 ENCOUNTER — Other Ambulatory Visit: Payer: Self-pay | Admitting: Obstetrics and Gynecology

## 2019-04-17 NOTE — Patient Instructions (Signed)
Dana Lynch  04/17/2019     @PREFPERIOPPHARMACY @   Your procedure is scheduled on  04/22/2019 .  Report to [name] at  [phone]  A.M.  Call this number if you have problems the morning of surgery:  [phone]   Remember:  Do not eat or drink after midnight.                       Take these medicines the morning of surgery with A SIP OF WATER  None    Do not wear jewelry, make-up or nail polish.  Do not wear lotions, powders, or perfumes. Please wear deodorant and brush your teeth.  Do not shave 48 hours prior to surgery.  Men may shave face and neck.  Do not bring valuables to the hospital.  [name] is not responsible for any belongings or valuables.  Contacts, dentures or bridgework may not be worn into surgery.  Leave your suitcase in the car.  After surgery it may be brought to your room.  For patients admitted to the hospital, discharge time will be determined by your treatment team.  Patients discharged the day of surgery will not be allowed to drive home.   Name and phone number of your driver:   family Special instructions:  Follow any instructions given to you by Dr Glo Herring about a bowel prep. DO NOT take any medications for diabetes the morning of your surgery.  Please read over the following fact sheets that you were given. Pain Booklet, Coughing and Deep Breathing, Blood Transfusion Information, Surgical Site Infection Prevention, Anesthesia Post-op Instructions and Care and Recovery After Surgery       Abdominal Hysterectomy, Care After This sheet gives you information about how to care for yourself after your procedure. Your health care provider may also give you more specific instructions. If you have problems or questions, contact your health care provider. What can I expect after the procedure? After the procedure, it is common to have:  Pain.  Tiredness (fatigue).  Poor appetite.  Lowered interest in sex.  Bleeding and  discharge from your vagina. You may need to use a pad in your underwear after this procedure. Follow these instructions at home: Medicines  Take over-the-counter and prescription medicines only as told by your doctor.  Do not take aspirin or ibuprofen. These medicines can cause bleeding.  Ask your doctor if the medicine prescribed to you: ? Requires you to avoid driving or using heavy machinery. ? Can cause trouble pooping (constipation). You may need to take these actions to prevent or treat trouble pooping:  Take over-the-counter or prescription medicines.  Eat foods that are high in fiber. These include beans, whole grains, and fresh fruits and vegetables.  Limit foods that are high in fat and processed sugars. These include fried or sweet foods. Surgical cut (incision) care      Follow instructions from your doctor about how to take care of your cut from surgery (incision). Make sure you: ? Wash your hands with soap and water before and after you change your bandage (dressing). If you cannot use soap and water, use hand sanitizer. ? Change your bandage as told by your doctor. ? Leave stitches (sutures), skin glue, or skin tape (adhesive) strips in place. They may need to stay in place for 2 weeks or longer. If tape strips get loose and curl up, you may trim the loose edges. Do  not remove tape strips completely unless your doctor says it is okay.  Check your cut from surgery every day for signs of infection. Check for: ? Redness, swelling, or pain. ? Fluid or blood. ? Warmth. ? Pus or a bad smell. Activity  Rest as told by your doctor. ? Do not sit for a long time without moving. Get up to take short walks every 1-2 hours. This is important. Ask for help if you feel weak or unsteady.  Do not lift anything that is heavier than 10 lb (4.5 kg), or the limit that you are told, until your doctor says that it is safe.  Do not drive or use heavy machinery while taking prescription  pain medicine.  Follow your doctor's advice about exercise, driving, and general activities. Return to your normal activities as told by your doctor. Ask your doctor what activities are safe for you. Lifestyle  Do not douche, use tampons, or have sex for at least 6 weeks or as told by your doctor.  Do not drink alcohol until your doctor says it is okay.  Do not use any products that contain nicotine or tobacco, such as cigarettes, e-cigarettes, and chewing tobacco. If you need help quitting, ask your doctor. General instructions   Drink enough fluid to keep your pee (urine) pale yellow.  Do not take baths, swim, or use a hot tub until your doctor approves. Ask your doctor if you may take showers. You may only be allowed to take sponge baths.  Try to have someone at home with you for the first 1-2 weeks to help you with your daily chores at home.  Keep the bandage dry until your doctor says it can be taken off.  Wear tight-fitting (compression) stockings as told by your health care provider. These stockings help to prevent blood clots and reduce swelling in your legs.  Keep all follow-up visits as told by your doctor. This is important. Contact a doctor if:  You have any of these signs of infection: ? Redness, swelling, or pain around your cut. ? Fluid or blood coming from your cut. ? Warmth coming from your cut. ? Pus or a bad smell coming from your cut. ? Chills or a fever.  Your cut breaks open.  You feel dizzy or light-headed.  You have pain or bleeding when you pee.  You keep having watery poop (diarrhea).  You keep feeling like you may vomit (nauseous) or keep vomiting.  You have unusual fluid (discharge) coming from your vagina.  You have a rash.  You have any type of reaction to your medicine that is not normal, or you develop an allergy to your medicine.  Your pain medicine does not help. Get help right away if:  You have a fever and your symptoms get worse  all of a sudden.  You have very bad pain in your belly (abdomen).  You are short of breath.  You faint.  You have pain, swelling, or redness of your leg.  You bleed a lot from your vagina and notice clumps of blood (clots). Summary  Do not take baths, swim, or use a hot tub until your doctor approves. Ask your doctor if you may take showers. You may only be allowed to take sponge baths.  Do not lift anything that is heavier than 10 lb (4.5 kg), or the limit that you are told, until your doctor says that it is safe.  Follow your doctor's advice about exercise, driving, and  general activities. Ask your doctor what activities are safe for you.  Try to have someone at home with you for the first 1-2 weeks to help you with your daily chores at home. This information is not intended to replace advice given to you by your health care provider. Make sure you discuss any questions you have with your health care provider. Document Released: 04/04/2008 Document Revised: 08/29/2018 Document Reviewed: 06/14/2016 Elsevier Patient Education  2020 [employer] Anesthesia, Adult, Care After This sheet gives you information about how to care for yourself after your procedure. Your health care provider may also give you more specific instructions. If you have problems or questions, contact your health care provider. What can I expect after the procedure? After the procedure, the following side effects are common:  Pain or discomfort at the IV site.  Nausea.  Vomiting.  Sore throat.  Trouble concentrating.  Feeling cold or chills.  Weak or tired.  Sleepiness and fatigue.  Soreness and body aches. These side effects can affect parts of the body that were not involved in surgery. Follow these instructions at home:  For at least 24 hours after the procedure:  Have a responsible adult stay with you. It is important to have someone help care for you until you are awake and alert.   Rest as needed.  Do not: ? Participate in activities in which you could fall or become injured. ? Drive. ? Use heavy machinery. ? Drink alcohol. ? Take sleeping pills or medicines that cause drowsiness. ? Make important decisions or sign legal documents. ? Take care of children on your own. Eating and drinking  Follow any instructions from your health care provider about eating or drinking restrictions.  When you feel hungry, start by eating small amounts of foods that are soft and easy to digest (bland), such as toast. Gradually return to your regular diet.  Drink enough fluid to keep your urine pale yellow.  If you vomit, rehydrate by drinking water, juice, or clear broth. General instructions  If you have sleep apnea, surgery and certain medicines can increase your risk for breathing problems. Follow instructions from your health care provider about wearing your sleep device: ? Anytime you are sleeping, including during daytime naps. ? While taking prescription pain medicines, sleeping medicines, or medicines that make you drowsy.  Return to your normal activities as told by your health care provider. Ask your health care provider what activities are safe for you.  Take over-the-counter and prescription medicines only as told by your health care provider.  If you smoke, do not smoke without supervision.  Keep all follow-up visits as told by your health care provider. This is important. Contact a health care provider if:  You have nausea or vomiting that does not get better with medicine.  You cannot eat or drink without vomiting.  You have pain that does not get better with medicine.  You are unable to pass urine.  You develop a skin rash.  You have a fever.  You have redness around your IV site that gets worse. Get help right away if:  You have difficulty breathing.  You have chest pain.  You have blood in your urine or stool, or you vomit blood. Summary   After the procedure, it is common to have a sore throat or nausea. It is also common to feel tired.  Have a responsible adult stay with you for the first 24 hours after general anesthesia. It is  important to have someone help care for you until you are awake and alert.  When you feel hungry, start by eating small amounts of foods that are soft and easy to digest (bland), such as toast. Gradually return to your regular diet.  Drink enough fluid to keep your urine pale yellow.  Return to your normal activities as told by your health care provider. Ask your health care provider what activities are safe for you. This information is not intended to replace advice given to you by your health care provider. Make sure you discuss any questions you have with your health care provider. Document Released: 10/02/2000 Document Revised: 06/29/2017 Document Reviewed: 02/09/2017 Elsevier Patient Education  2020 [employer]. How to Use Chlorhexidine for Bathing Chlorhexidine gluconate (CHG) is a germ-killing (antiseptic) solution that is used to clean the skin. It can get rid of the bacteria that normally live on the skin and can keep them away for about 24 hours. To clean your skin with CHG, you may be given:  A CHG solution to use in the shower or as part of a sponge bath.  A prepackaged cloth that contains CHG. Cleaning your skin with CHG may help lower the risk for infection:  While you are staying in the intensive care unit of the hospital.  If you have a vascular access, such as a central line, to provide short-term or long-term access to your veins.  If you have a catheter to drain urine from your bladder.  If you are on a ventilator. A ventilator is a machine that helps you breathe by moving air in and out of your lungs.  After surgery. What are the risks? Risks of using CHG include:  A skin reaction.  Hearing loss, if CHG gets in your ears.  Eye injury, if CHG gets in your eyes and  is not rinsed out.  The CHG product catching fire. Make sure that you avoid smoking and flames after applying CHG to your skin. Do not use CHG:  If you have a chlorhexidine allergy or have previously reacted to chlorhexidine.  On babies younger than 69 months of age. How to use CHG solution  Use CHG only as told by your health care provider, and follow the instructions on the label.  Use the full amount of CHG as directed. Usually, this is one bottle. During a shower Follow these steps when using CHG solution during a shower (unless your health care provider gives you different instructions): 1. Start the shower. 2. Use your normal soap and shampoo to wash your face and hair. 3. Turn off the shower or move out of the shower stream. 4. Pour the CHG onto a clean washcloth. Do not use any type of brush or rough-edged sponge. 5. Starting at your neck, lather your body down to your toes. Make sure you follow these instructions: ? If you will be having surgery, pay special attention to the part of your body where you will be having surgery. Scrub this area for at least 1 minute. ? Do not use CHG on your head or face. If the solution gets into your ears or eyes, rinse them well with water. ? Avoid your genital area. ? Avoid any areas of skin that have broken skin, cuts, or scrapes. ? Scrub your back and under your arms. Make sure to wash skin folds. 6. Let the lather sit on your skin for 1-2 minutes or as long as told by your health care provider. 7. Thoroughly  rinse your entire body in the shower. Make sure that all body creases and crevices are rinsed well. 8. Dry off with a clean towel. Do not put any substances on your body afterward-such as powder, lotion, or perfume-unless you are told to do so by your health care provider. Only use lotions that are recommended by the manufacturer. 9. Put on clean clothes or pajamas. 10. If it is the night before your surgery, sleep in clean sheets.   During a sponge bath Follow these steps when using CHG solution during a sponge bath (unless your health care provider gives you different instructions): 1. Use your normal soap and shampoo to wash your face and hair. 2. Pour the CHG onto a clean washcloth. 3. Starting at your neck, lather your body down to your toes. Make sure you follow these instructions: ? If you will be having surgery, pay special attention to the part of your body where you will be having surgery. Scrub this area for at least 1 minute. ? Do not use CHG on your head or face. If the solution gets into your ears or eyes, rinse them well with water. ? Avoid your genital area. ? Avoid any areas of skin that have broken skin, cuts, or scrapes. ? Scrub your back and under your arms. Make sure to wash skin folds. 4. Let the lather sit on your skin for 1-2 minutes or as long as told by your health care provider. 5. Using a different clean, wet washcloth, thoroughly rinse your entire body. Make sure that all body creases and crevices are rinsed well. 6. Dry off with a clean towel. Do not put any substances on your body afterward-such as powder, lotion, or perfume-unless you are told to do so by your health care provider. Only use lotions that are recommended by the manufacturer. 7. Put on clean clothes or pajamas. 8. If it is the night before your surgery, sleep in clean sheets. How to use CHG prepackaged cloths  Only use CHG cloths as told by your health care provider, and follow the instructions on the label.  Use the CHG cloth on clean, dry skin.  Do not use the CHG cloth on your head or face unless your health care provider tells you to.  When washing with the CHG cloth: ? Avoid your genital area. ? Avoid any areas of skin that have broken skin, cuts, or scrapes. Before surgery Follow these steps when using a CHG cloth to clean before surgery (unless your health care provider gives you different instructions): 1. Using  the CHG cloth, vigorously scrub the part of your body where you will be having surgery. Scrub using a back-and-forth motion for 3 minutes. The area on your body should be completely wet with CHG when you are done scrubbing. 2. Do not rinse. Discard the cloth and let the area air-dry. Do not put any substances on the area afterward, such as powder, lotion, or perfume. 3. Put on clean clothes or pajamas. 4. If it is the night before your surgery, sleep in clean sheets.  For general bathing Follow these steps when using CHG cloths for general bathing (unless your health care provider gives you different instructions). 1. Use a separate CHG cloth for each area of your body. Make sure you wash between any folds of skin and between your fingers and toes. Wash your body in the following order, switching to a new cloth after each step: ? The front of your neck, shoulders, and  chest. ? Both of your arms, under your arms, and your hands. ? Your stomach and groin area, avoiding the genitals. ? Your right leg and foot. ? Your left leg and foot. ? The back of your neck, your back, and your buttocks. 2. Do not rinse. Discard the cloth and let the area air-dry. Do not put any substances on your body afterward-such as powder, lotion, or perfume-unless you are told to do so by your health care provider. Only use lotions that are recommended by the manufacturer. 3. Put on clean clothes or pajamas. Contact a health care provider if:  Your skin gets irritated after scrubbing.  You have questions about using your solution or cloth. Get help right away if:  Your eyes become very red or swollen.  Your eyes itch badly.  Your skin itches badly and is red or swollen.  Your hearing changes.  You have trouble seeing.  You have swelling or tingling in your mouth or throat.  You have trouble breathing.  You swallow any chlorhexidine. Summary  Chlorhexidine gluconate (CHG) is a germ-killing (antiseptic)  solution that is used to clean the skin. Cleaning your skin with CHG may help to lower your risk for infection.  You may be given CHG to use for bathing. It may be in a bottle or in a prepackaged cloth to use on your skin. Carefully follow your health care provider's instructions and the instructions on the product label.  Do not use CHG if you have a chlorhexidine allergy.  Contact your health care provider if your skin gets irritated after scrubbing. This information is not intended to replace advice given to you by your health care provider. Make sure you discuss any questions you have with your health care provider. Document Released: 03/20/2012 Document Revised: 09/12/2018 Document Reviewed: 05/24/2017 Elsevier Patient Education  2020 [employer].

## 2019-04-18 ENCOUNTER — Other Ambulatory Visit: Payer: Self-pay

## 2019-04-18 ENCOUNTER — Encounter (HOSPITAL_COMMUNITY): Payer: Self-pay

## 2019-04-18 ENCOUNTER — Other Ambulatory Visit (HOSPITAL_COMMUNITY)
Admission: RE | Admit: 2019-04-18 | Discharge: 2019-04-18 | Disposition: A | Discharge status: Home / Self Care | Payer: BC Managed Care – PPO | Source: Ambulatory Visit | Attending: Obstetrics and Gynecology | Admitting: Obstetrics and Gynecology

## 2019-04-18 ENCOUNTER — Encounter (HOSPITAL_COMMUNITY)
Admission: RE | Admit: 2019-04-18 | Discharge: 2019-04-18 | Disposition: A | Discharge status: Home / Self Care | Payer: BC Managed Care – PPO | Source: Ambulatory Visit | Attending: Obstetrics and Gynecology | Admitting: Obstetrics and Gynecology

## 2019-04-18 DIAGNOSIS — Z20828 Contact with and (suspected) exposure to other viral communicable diseases: Secondary | ICD-10-CM | POA: Insufficient documentation

## 2019-04-18 DIAGNOSIS — Z01812 Encounter for preprocedural laboratory examination: Secondary | ICD-10-CM | POA: Diagnosis present

## 2019-04-18 LAB — COMPREHENSIVE METABOLIC PANEL
ALT: 35 U/L (ref 0–44)
AST: 19 U/L (ref 15–41)
Albumin: 4.1 g/dL (ref 3.5–5.0)
Alkaline Phosphatase: 64 U/L (ref 38–126)
Anion gap: 7 (ref 5–15)
BUN: 11 mg/dL (ref 6–20)
CO2: 22 mmol/L (ref 22–32)
Calcium: 8.8 mg/dL — ABNORMAL LOW (ref 8.9–10.3)
Chloride: 111 mmol/L (ref 98–111)
Creatinine, Ser: 0.54 mg/dL (ref 0.44–1.00)
GFR calc Af Amer: >60 mL/min (ref >60)
GFR calc non Af Amer: >60 mL/min (ref >60)
Glucose, Bld: 169 mg/dL — ABNORMAL HIGH (ref 70–99)
Potassium: 3.9 mmol/L (ref 3.5–5.1)
Sodium: 140 mmol/L (ref 135–145)
Total Bilirubin: 0.6 mg/dL (ref 0.3–1.2)
Total Protein: 6.9 g/dL (ref 6.5–8.1)

## 2019-04-18 LAB — URINALYSIS, ROUTINE W REFLEX MICROSCOPIC
Bacteria, UA: NONE SEEN
Bilirubin Urine: NEGATIVE
Glucose, UA: NEGATIVE mg/dL
Ketones, ur: NEGATIVE mg/dL
Leukocytes,Ua: NEGATIVE
Nitrite: NEGATIVE
Protein, ur: NEGATIVE mg/dL
Specific Gravity, Urine: 1.015 (ref 1.005–1.030)
pH: 5 (ref 5.0–8.0)

## 2019-04-18 LAB — HEMOGLOBIN A1C
Hgb A1c MFr Bld: 9.8 % — ABNORMAL HIGH (ref 4.8–5.6)
Mean Plasma Glucose: 234.56 mg/dL

## 2019-04-18 LAB — CBC
HCT: 46.6 % — ABNORMAL HIGH (ref 36.0–46.0)
Hemoglobin: 15.2 g/dL — ABNORMAL HIGH (ref 12.0–15.0)
MCH: 30.3 pg (ref 26.0–34.0)
MCHC: 32.6 g/dL (ref 30.0–36.0)
MCV: 93 fL (ref 80.0–100.0)
Platelets: 270 10*3/uL (ref 150–400)
RBC: 5.01 MIL/uL (ref 3.87–5.11)
RDW: 11.8 % (ref 11.5–15.5)
WBC: 7 10*3/uL (ref 4.0–10.5)
nRBC: 0 % (ref 0.0–0.2)

## 2019-04-18 LAB — SARS CORONAVIRUS 2 (TAT 6-24 HRS): SARS Coronavirus 2: NEGATIVE

## 2019-04-18 LAB — HCG, SERUM, QUALITATIVE: Preg, Serum: NEGATIVE

## 2019-04-18 LAB — TYPE AND SCREEN
ABO/RH(D): O POS
Antibody Screen: NEGATIVE

## 2019-04-18 LAB — GLUCOSE, CAPILLARY: Glucose-Capillary: 166 mg/dL — ABNORMAL HIGH (ref 70–99)

## 2019-04-20 ENCOUNTER — Telehealth: Payer: Self-pay | Admitting: Obstetrics and Gynecology

## 2019-04-21 NOTE — Telephone Encounter (Signed)
I had advised pt that the surgeon would be the one to decide at what A1C the surgeon is willing to do the surgery. And would depend on the urgency. Please see previous note.

## 2019-04-30 ENCOUNTER — Telehealth (INDEPENDENT_AMBULATORY_CARE_PROVIDER_SITE_OTHER): Payer: BC Managed Care – PPO | Admitting: Obstetrics and Gynecology

## 2019-04-30 ENCOUNTER — Encounter: Payer: BC Managed Care – PPO | Admitting: Obstetrics and Gynecology

## 2019-04-30 ENCOUNTER — Encounter: Payer: Self-pay | Admitting: Obstetrics and Gynecology

## 2019-04-30 ENCOUNTER — Other Ambulatory Visit: Payer: Self-pay

## 2019-04-30 VITALS — Ht 68.0 in | Wt 208.0 lb

## 2019-04-30 DIAGNOSIS — D219 Benign neoplasm of connective and other soft tissue, unspecified: Secondary | ICD-10-CM | POA: Diagnosis not present

## 2019-04-30 DIAGNOSIS — E119 Type 2 diabetes mellitus without complications: Secondary | ICD-10-CM

## 2019-04-30 NOTE — Patient Instructions (Signed)
Abdominal Hysterectomy Abdominal hysterectomy is a surgical procedure to remove the womb (uterus). The uterus is the muscular organ that houses a developing baby. This surgery may be done if:  You have cancer.  You have growths (tumors or fibroids) in the uterus.  You have long-term (chronic) pain.  You are bleeding.  Your uterus has slipped down into your vagina (uterine prolapse).  You have a condition in which the tissue that lines the uterus grows outside of its normal location (endometriosis).  You have an infection in your uterus.  You are having problems with your menstrual cycle. Depending on why you are having this procedure, you may also have other reproductive organs removed. These could include:  The part of your vagina that connects with your uterus (cervix).  The organs that make eggs (ovaries).  The tubes that connect the ovaries to the uterus (fallopian tubes). Tell a health care provider about:  Any allergies you have.  All medicines you are taking, including vitamins, herbs, eye drops, creams, and over-the-counter medicines.  Any problems you or family members have had with anesthetic medicines.  Any blood disorders you have.  Any surgeries you have had.  Any medical conditions you have.  Whether you are pregnant or may be pregnant. What are the risks? Generally, this is a safe procedure. However, problems may occur, including:  Bleeding.  Infection.  Allergic reactions to medicines or dyes.  Damage to other structures or organs.  Nerve injury.  Decreased interest in sex or pain during sex.  Blood clots that can break free and travel to your lungs. What happens before the procedure? Staying hydrated Follow instructions from your health care provider about hydration, which may include:  Up to 2 hours before the procedure - you may continue to drink clear liquids, such as water, clear fruit juice, black coffee, and plain tea Eating and  drinking restrictions Follow instructions from your health care provider about eating and drinking, which may include:  8 hours before the procedure - stop eating heavy meals or foods such as meat, fried foods, or fatty foods.  6 hours before the procedure - stop eating light meals or foods, such as toast or cereal.  6 hours before the procedure - stop drinking milk or drinks that contain milk.  2 hours before the procedure - stop drinking clear liquids. Medicines  Ask your health care provider about: ? Changing or stopping your regular medicines. This is especially important if you are taking diabetes medicines or blood thinners. ? Taking medicines such as aspirin and ibuprofen. These medicines can thin your blood. Do not take these medicines before your procedure if your health care provider instructs you not to.  You may be given antibiotic medicine to help prevent infection. Take it as told by your health care provider.  You may be asked to take laxatives to prevent constipation. General instructions  Ask your health care provider how your surgical site will be marked or identified.  You may be asked to shower with a germ-killing soap.  Plan to have someone take you home from the hospital.  Do not use any products that contain nicotine or tobacco, such as cigarettes and e-cigarettes. If you need help quitting, ask your health care provider.  You may have an exam or testing.  You may have a blood or urine sample taken.  You may need to have an enema to clean out your rectum and lower colon.  This procedure can affect the way   you feel about yourself. Talk to your health care provider about the physical and emotional changes this procedure may cause. What happens during the procedure?  To lower your risk of infection: ? Your health care team will wash or sanitize their hands. ? Your skin will be washed with soap. ? Hair may be removed from the surgical area.  An IV tube  will be inserted into one of your veins.  You will be given one or more of the following: ? A medicine to help you relax (sedative). ? A medicine to make you fall asleep (general anesthetic).  Tight-fitting (compression) stockings will be placed on your legs to promote circulation.  A thin, flexible tube (catheter) will be inserted to help drain your urine.  The surgeon will make a cut (incision) through the skin in your lower belly. The incision may go side-to-side or up-and-down.  The surgeon will move aside the body tissue that covers your uterus. The surgeon will then carefully take out your uterus along with any of the other organs that need to be removed.  Bleeding will be controlled with clamps or sutures.  The surgeon will close your incision with stitches (sutures), skin glue, or adhesive strips.  A bandage (dressing) will be placed over the incision. The procedure may vary among health care providers and hospitals. What happens after the procedure?  You will be given pain medicine as needed.  Your blood pressure, heart rate, breathing rate, and blood oxygen level will be monitored until the medicines you were given have worn off.  You will need to stay in the hospital to recover for one to two days. Ask your health care provider how long you will need to stay in the hospital after your procedure.  You may have a liquid diet at first. You will most likely return to your usual diet the day after surgery.  You will still have the urinary catheter in place. It will likely be removed the day after surgery.  You may have to wear compression stockings. These stockings help to prevent blood clots and reduce swelling in your legs.  You will be encouraged to walk as soon as possible. You will also use a device or do breathing exercises to keep your lungs clear.  You may need to use a sanitary napkin for vaginal discharge. Summary  Abdominal hysterectomy is a surgical procedure  to remove the womb (uterus). The uterus is the muscular organ that houses a developing baby.  This procedure can affect the way you feel about yourself. Talk to your health care provider about the physical and emotional changes this procedure may cause.  You will be given medicines for pain after the procedure.  You will need to stay in the hospital to recover. Ask your health care provider how long you will need to stay in the hospital after your procedure. This information is not intended to replace advice given to you by your health care provider. Make sure you discuss any questions you have with your health care provider. Document Released: 07/01/2013 Document Revised: 07/31/2018 Document Reviewed: 06/14/2016 Elsevier Patient Education  2020 [employer]  

## 2019-04-30 NOTE — Progress Notes (Signed)
.     [facility] VIRTUAL GYN VISIT ENCOUNTER NOTE Patient name: Dana Lynch MRN [id number]  Date of birth: [birth year]  I connected with patient on 04/30/19 at  8:50 AM EDT by phone and verified that I am speaking with the correct person using two identifiers, name,and voice recognition. Due to COVID-19 recommendations, pt is not currently in the office.    I discussed the limitations, risks, security and privacy concerns of performing an evaluation and management service by telephone and the availability of in person appointments. I also discussed with the patient that there may be a patient responsible charge related to this service. The patient expressed understanding and agreed to proceed.   Chief Complaint:   Review of preoperative plans for abdominal supracervical hysterectomy History of Present Illness:   Dana Lynch is a 48 y.o. G33P3 Caucasian female being evaluated today for progress with diabetic control, and checking to see if there are any preoperative concerns outstanding.     No LMP recorded (lmp unknown). Patient is postmenopausal. The current method of family planning is post menopausal status.  Last pap normal 03/03/2019. Results were:  normal  She has an appointment Dr. Dorris Fetch by telephone visit 2 weeks before the surgery she will have a hemoglobin A1c checked during preop labs.  She is checking her blood sugar 4 times a day fasting and preprandial.  She is having most of her blood sugars less than 100.  Initially she had some shaky spells but recently she tolerated a 83 without shakiness recently. We discussed the surgery and the fact that she has some tenderness at her old appendectomy scar.  Plans are for excision and removal of that old scar as a comfort measure to [employer] in the transverse lower abdominal scar.  We will mark the incision up during the preoperative discussion before we go back for surgery, patient made aware. Review of Systems:   Pertinent items  are noted in HPI.    Pertinent History Reviewed:  Reviewed past medical,surgical, social, obstetrical and family history.  Reviewed problem list, medications and allergies. Physical Assessment:   Vitals:   04/30/19 0836  Weight: 208 lb (94.3 kg)  Height: 5\' 8"  (1.727 m)  Body mass index is 31.63 kg/m.       Physical Examination:   General:  Alert, oriented and cooperative.   Mental Status: Normal mood and affect perceived. Normal judgment and thought content.  Physical exam deferred due to nature of the encounter  No results found for this or any previous visit (from the past 24 hour(s)).  Assessment & Plan:  1) uterine fibroids, symptomatic scheduled for abdominal supracervical hysterectomy 10 November   2) diabetes type 2 with improved glucose control>   Meds: No orders of the defined types were placed in this encounter.  Preoperative plate orders to be placed No orders of the defined types were placed in this encounter.   I discussed the assessment and treatment plan with the patient. The patient was provided an opportunity to ask questions and all were answered. The patient agreed with the plan and demonstrated an understanding of the instructions.   The patient was advised to call back or seek an in-person evaluation/go to the ED if the symptoms worsen or if the condition fails to improve as anticipated.  I provided 12 minutes of non-face-to-face time during this encounter.   No follow-ups on file.  Jonnie Kind CNM, [facility] 04/30/2019 9:12 AM

## 2019-05-02 NOTE — Telephone Encounter (Signed)
Surgery rescheduled. Rationale discussed with pt, who is seriously frustrated by the delay.

## 2019-05-12 ENCOUNTER — Encounter: Payer: Self-pay | Admitting: "Endocrinology

## 2019-05-12 ENCOUNTER — Ambulatory Visit: Payer: BC Managed Care – PPO | Admitting: "Endocrinology

## 2019-05-12 ENCOUNTER — Other Ambulatory Visit: Payer: Self-pay

## 2019-05-12 VITALS — Wt 219.0 lb

## 2019-05-12 DIAGNOSIS — E782 Mixed hyperlipidemia: Secondary | ICD-10-CM

## 2019-05-12 DIAGNOSIS — E1165 Type 2 diabetes mellitus with hyperglycemia: Secondary | ICD-10-CM | POA: Diagnosis not present

## 2019-05-12 DIAGNOSIS — I10 Essential (primary) hypertension: Secondary | ICD-10-CM

## 2019-05-12 MED ORDER — GLIPIZIDE ER 5 MG PO TB24: 10.0000 mg | ORAL_TABLET | Freq: Every day | ORAL | 3 refills | Status: DC

## 2019-05-12 MED ORDER — SIMVASTATIN 20 MG PO TABS: 20.0000 mg | ORAL_TABLET | Freq: Every day | ORAL | 3 refills | Status: AC

## 2019-05-12 NOTE — Progress Notes (Signed)
05/12/2019  Endocrinology follow-up note    Subjective:    Patient ID: Dana Lynch, female    DOB: 1970-12-28. Patient is being seen in follow up for management of diabetes requested by  Sinda Du, MD  Past Medical History:  Diagnosis Date  . Bowel habit changes 12/22/2013   Alternates constipation and diarrhea. Will refer to GI   . Diabetes ([place]) 02/16/2014  . Fibroids 12/22/2013  . Hypertension   . Unspecified symptom associated with female genital organs 12/22/2013   Past Surgical History:  Procedure Laterality Date  . APPENDECTOMY    . BREAST BIOPSY    . ENDOMETRIAL ABLATION    . TONSILLECTOMY AND ADENOIDECTOMY     Social History   Socioeconomic History  . Marital status: Married    Spouse name: Not on file  . Number of children: 3  . Years of education: Not on file  . Highest education level: Not on file  Occupational History  . Not on file  Social Needs  . Financial resource strain: Not on file  . Food insecurity    Worry: Not on file    Inability: Not on file  . Transportation needs    Medical: Not on file    Non-medical: Not on file  Tobacco Use  . Smoking status: Never Smoker  . Smokeless tobacco: Never Used  Substance and Sexual Activity  . Alcohol use: Yes    Comment: occ  . Drug use: No  . Sexual activity: Yes    Birth control/protection: Surgical    Comment: ablation  Lifestyle  . Physical activity    Days per week: Not on file    Minutes per session: Not on file  . Stress: Not on file  Relationships  . Social Herbalist on phone: Not on file    Gets together: Not on file    Attends religious service: Not on file    Active member of club or organization: Not on file    Attends meetings of clubs or organizations: Not on file    Relationship status: Not on file  Other Topics Concern  . Not on file  Social History Narrative  . Not on file   Outpatient Encounter Medications as of 05/12/2019  Medication Sig  .  Cholecalciferol 5000 units capsule Take 1 capsule (5,000 Units total) by mouth daily. (Patient taking differently: Take 5,000 Units by mouth. )  . glipiZIDE (GLUCOTROL XL) 5 MG 24 hr tablet Take 2 tablets (10 mg total) by mouth daily with breakfast.  . lisinopril (PRINIVIL,ZESTRIL) 5 MG tablet TAKE 1 TABLET(5 MG) BY MOUTH DAILY  . metFORMIN (GLUCOPHAGE XR) 500 MG 24 hr tablet Take 1 tablet (500 mg total) by mouth 2 (two) times daily after a meal.  . simvastatin (ZOCOR) 20 MG tablet Take 1 tablet (20 mg total) by mouth daily at 6 PM.  . [DISCONTINUED] glipiZIDE (GLUCOTROL XL) 10 MG 24 hr tablet Take 1 tablet (10 mg total) by mouth daily with breakfast.  . [DISCONTINUED] simvastatin (ZOCOR) 20 MG tablet TAKE 1 TABLET(20 MG) BY MOUTH DAILY  . blood glucose meter kit and supplies KIT Dispense based on patient and insurance preference. Use up to four times daily as directed. (FOR ICD-10 E11.65)  . Blood Glucose Monitoring Suppl (ONETOUCH VERIO) w/Device KIT 1 each by Does not apply route as needed.  Marland Kitchen glucose blood (ONETOUCH VERIO) test strip Use as instructed   No facility-administered encounter medications on file as  of 05/12/2019.    ALLERGIES: No Known Allergies VACCINATION STATUS:  There is no immunization history on file for this patient.  Diabetes She presents for her follow-up diabetic visit. She has type 2 diabetes mellitus. Onset time: She was diagnosed at approximate age of 19 years. Her disease course has been improving. There are no hypoglycemic associated symptoms. Pertinent negatives for hypoglycemia include no confusion, headaches, pallor or seizures. Pertinent negatives for diabetes include no blurred vision, no chest pain, no fatigue, no polydipsia, no polyphagia and no polyuria. There are no hypoglycemic complications. Symptoms are improving. There are no diabetic complications. Risk factors for coronary artery disease include diabetes mellitus, dyslipidemia, family history, obesity,  hypertension and sedentary lifestyle. Current diabetic treatment includes oral agent (monotherapy) (She is on metformin 500 mg by mouth daily.). Her weight is increasing steadily. She is following a generally unhealthy diet. When asked about meal planning, she reported none. She has not had a previous visit with a dietitian. She rarely participates in exercise. Her breakfast blood glucose range is generally 130-140 mg/dl. Her lunch blood glucose range is generally 110-130 mg/dl. Her dinner blood glucose range is generally 110-130 mg/dl. Her bedtime blood glucose range is generally 130-140 mg/dl. Her overall blood glucose range is 130-140 mg/dl. (She returns with significantly improved glycemic profile.  She is scheduled to have elective abdominal hysterectomy in 1 week.  ) An ACE inhibitor/angiotensin II receptor blocker is being taken.  Hyperlipidemia This is a chronic problem. The current episode started more than 1 year ago. The problem is uncontrolled. Exacerbating diseases include diabetes and obesity. Pertinent negatives include no chest pain, myalgias or shortness of breath. Current antihyperlipidemic treatment includes statins. Risk factors for coronary artery disease include dyslipidemia, family history, diabetes mellitus, obesity, hypertension and a sedentary lifestyle.  Hypertension This is a chronic problem. The current episode started more than 1 year ago. The problem is controlled. Pertinent negatives include no blurred vision, chest pain, headaches, palpitations or shortness of breath. Risk factors for coronary artery disease include diabetes mellitus, dyslipidemia, family history, obesity and sedentary lifestyle. Past treatments include ACE inhibitors.   Review of systems: Limited as above  Objective:    Wt 219 lb (99.3 kg)   LMP  (LMP Unknown)   BMI 33.30 kg/m   Wt Readings from Last 3 Encounters:  05/12/19 219 lb (99.3 kg)  04/30/19 208 lb (94.3 kg)  04/18/19 208 lb (94.3 kg)       CMP     Component Value Date/Time   NA 140 04/18/2019 0720   NA 139 05/21/2017 1208   K 3.9 04/18/2019 0720   CL 111 04/18/2019 0720   CO2 22 04/18/2019 0720   GLUCOSE 169 (H) 04/18/2019 0720   BUN 11 04/18/2019 0720   BUN 15 05/21/2017 1208   CREATININE 0.54 04/18/2019 0720   CREATININE 0.63 03/05/2019 0733   CALCIUM 8.8 (L) 04/18/2019 0720   PROT 6.9 04/18/2019 0720   PROT 7.0 01/19/2017 0955   ALBUMIN 4.1 04/18/2019 0720   ALBUMIN 5.0 05/21/2017 1208   AST 19 04/18/2019 0720   ALT 35 04/18/2019 0720   ALKPHOS 64 04/18/2019 0720   BILITOT 0.6 04/18/2019 0720   BILITOT 0.5 01/19/2017 0955   GFRNONAA >60 04/18/2019 0720   GFRNONAA 105 03/05/2019 0733   GFRAA >60 04/18/2019 0720   GFRAA 121 03/05/2019 0733     Diabetic Labs (most recent): Lab Results  Component Value Date   HGBA1C 9.8 (H) 04/18/2019   HGBA1C >  14.0 (H) 03/05/2019   HGBA1C 7.0 (H) 05/06/2018     Lipid Panel ( most recent) Lipid Panel     Component Value Date/Time   CHOL 194 01/19/2017 0955   TRIG 282 (H) 01/19/2017 0955   HDL 33 (L) 01/19/2017 0955   CHOLHDL 5.9 (H) 01/19/2017 0955   CHOLHDL 4.7 04/20/2014 0915   VLDL 14 04/20/2014 0915   LDLCALC 105 (H) 01/19/2017 0955    Assessment & Plan:   1. Uncontrolled type 2 diabetes mellitus without complication, without long-term current use of insulin ([place])  - Patient has currently controlled asymptomatic type 2 DM since 48 years of age. - She has responded to addition of glipizide to her metformin.  Her recent average blood glucose is between 125-145.    Her most recent A1c was 9.8%, significantly improving from   >14%. - Recent labs reviewed.   She does not report gross complications from diabetes, however she remains at a high risk for more acute and chronic complications which include CAD, CVA, CKD, retinopathy, and neuropathy. These are all discussed in detail with the patient.  - I have counseled the patient on diet management and  weight loss, by adopting a carbohydrate restricted/protein rich diet.  - she  admits there is a room for improvement in her diet and drink choices. -  Suggestion is made for her to avoid simple carbohydrates  from her diet including Cakes, Sweet Desserts / Pastries, Ice Cream, Soda (diet and regular), Sweet Tea, Candies, Chips, Cookies, Sweet Pastries,  Store Bought Juices, Alcohol in Excess of  1-2 drinks a day, Artificial Sweeteners, Coffee Creamer, and "Sugar-free" Products. This will help patient to have stable blood glucose profile and potentially avoid unintended weight gain.   - I encouraged the patient to switch to  unprocessed or minimally processed complex starch and increased protein intake (animal or plant source), fruits, and vegetables.  - Patient is advised to stick to a routine mealtimes to eat 3 meals  a day and avoid unnecessary snacks ( to snack only to correct hypoglycemia).   - I have approached patient with the following individualized plan to manage diabetes and patient agrees:   -She is engaged properly recently motivated by the need to control glycemia to target before her surgery.  Her average blood glucose at this time is between 125-145.  This clears her for her planned elective hysterectomy. -She will not require insulin treatment at this time.  She is advised to stay engaged and continue Metformin 500 mg p.o. twice daily, continue glipizide 10 mg XL p.o. daily at breakfast.  She he is advised to  continue to test her glucose 4 times a day-before meals and at bedtime.   2) BP/HTN: Her blood pressure is controlled to target.  Continue current medications including ACEI/ARB. 3) Lipids/HPL:   Her lipid panel shows uncontrolled LDL at 105,   hypertriglyceridemia of 280.  Her simvastatin 20 mg p.o. nightly is refilled.      Better control of diabetes will help with triglycerides as well, will have fasting lipid panel before her next visit.  4)  Weight/Diet: CDE Consult  has been requested , exercise, and detailed carbohydrates information provided.  5) Chronic Care/Health Maintenance:  -Patient is on ACEI/ARB and Statin medications and encouraged to continue to follow up with Ophthalmology, Podiatrist at least yearly or according to recommendations, and advised to  stay away from smoking. I have recommended yearly flu vaccine and pneumonia vaccination at least every 5  years; moderate intensity exercise for up to 150 minutes weekly; and  sleep for at least 7 hours a day.  - I advised patient to maintain close follow up with Sinda Du, MD for primary care needs.  - Time spent with the patient: 25 min, of which >50% was spent in reviewing her blood glucose logs , discussing her hypoglycemia and hyperglycemia episodes, reviewing her current and  previous labs / studies and medications  doses and developing a plan to avoid hypoglycemia and hyperglycemia. Please refer to Patient Instructions for Blood Glucose Monitoring and Insulin/Medications Dosing Guide"  in media tab for additional information. Please  also refer to " Patient Self Inventory" in the Media  tab for reviewed elements of pertinent patient history.  [name] participated in the discussions, expressed understanding, and voiced agreement with the above plans.  All questions were answered to her satisfaction. she is encouraged to contact clinic should she have any questions or concerns prior to her return visit.   Follow up plan: - Return in about 9 weeks (around 07/14/2019) for Bring Meter and Logs- A1c in Office.  Glade Lloyd, MD Phone: [phone]  Fax: [phone]  -  This note was partially dictated with voice recognition software. Similar sounding words can be transcribed inadequately or may not  be corrected upon review.  05/12/2019, 4:49 PM

## 2019-05-15 NOTE — Patient Instructions (Signed)
Dana Lynch  05/15/2019     @PREFPERIOPPHARMACY @   Your procedure is scheduled on  05/19/2019 .  Report to [name] at  1130  A.M.  Call this number if you have problems the morning of surgery:  [phone]   Remember:  Do not eat or drink after midnight.                         Take these medicines the morning of surgery with A SIP OF WATER None    Do not wear jewelry, make-up or nail polish.  Do not wear lotions, powders, or perfumes. Please wear deodorant and brush your teeth.  Do not shave 48 hours prior to surgery.  Men may shave face and neck.  Do not bring valuables to the hospital.  [facility] is not responsible for any belongings or valuables.  Contacts, dentures or bridgework may not be worn into surgery.  Leave your suitcase in the car.  After surgery it may be brought to your room.  For patients admitted to the hospital, discharge time will be determined by your treatment team.  Patients discharged the day of surgery will not be allowed to drive home.   Name and phone number of your driver:   family Special instructions:  None  Please read over the following fact sheets that you were given. Anesthesia Post-op Instructions and Care and Recovery After Surgery       Supracervical Hysterectomy, Care After This sheet gives you information about how to care for yourself after your procedure. Your health care provider may also give you more specific instructions. If you have problems or questions, contact your health care provider. What can I expect after the procedure? After the procedure, it is common to have some discomfort, tenderness, swelling, and bruising at the surgical area. This normally lasts for about 2 weeks. Follow these instructions at home: Medicines  Take over-the-counter and prescription medicines only as told by your health care provider.  Do not take aspirin. It can cause bleeding. Ask your health care provider when it  is safe to use aspirin again.  Do not drive or use heavy machinery while taking prescription pain medicine.  To prevent or treat constipation while you are taking prescription pain medicine, your health care provider may recommend that you: ? Drink enough fluid to keep your urine pale yellow. ? Take over-the-counter or prescription medicines. ? Eat foods that are high in fiber, such as fresh fruits and vegetables, whole grains, and beans. ? Limit foods that are high in fat and processed sugars, such as fried and sweet foods. Activity  Get plenty of rest and sleep.  Try to have someone home with you for 1-2 weeks to help you with everyday chores.  Return to your normal activities as told by your health care provider. Ask your health care provider what activities are safe for you.  Do not lift anything that is heavier than 10 lb (4.5 kg) or the limit that your health care provider tells you until he or she says that it is safe.  Do not douche, use tampons, or have sex for at least 6 weeks or until your health care provider says it is safe to do so. Incision care   Follow instructions from your health care provider about how to take care of your incisions. Make sure you: ? Wash your hands with soap and water  before you change your bandage (dressing). If soap and water are not available, use hand sanitizer. ? Change your dressing as told by your health care provider. ? Leave stitches (sutures), skin glue, or adhesive strips in place. These skin closures may need to stay in place for 2 weeks or longer. If adhesive strip edges start to loosen and curl up, you may trim the loose edges. Do not remove adhesive strips completely unless your health care provider tells you to do that.  Check your incision area every day for signs of infection. Check for: ? Redness, swelling, or pain. ? Fluid or blood. ? Warmth. ? Pus or a bad smell.  Take showers instead of baths for 2-3 weeks or as told by  your health care provider. Eating and drinking  Drink enough fluids to keep your urine clear or pale yellow.  Do not drink alcohol until your health care provider says it is okay to do so. General instructions  Monitor your temperature for as long as told by your health care provider.  Keep all follow-up visits as told by your health care provider. This is important. Contact a health care provider if:  You have redness, swelling, or pain around an incision.  You have chills or fever.  You have fluid or blood coming from an incision.  An incision feels warm to the touch.  You have pus or a bad smell coming from an incision.  Your incisions break open.  You feel dizzy or lightheaded.  You have pain or bleeding when you urinate.  You have diarrhea that does not go away.  You have nausea and vomiting that do not go away.  You have abnormal vaginal discharge.  You have a rash.  You have pain that does not go away when you take medicine. Get help right away if:  You have a fever and your symptoms suddenly get worse.  You have severe abdominal pain.  You have chest pain.  You have shortness of breath.  You faint.  You have pain, swelling, or redness in your leg.  You have heavy vaginal bleeding with blood clots. Summary  After the procedure, it is common to have some discomfort, tenderness, swelling, and bruising at the surgical site. This normally lasts for about 2 weeks.  Get help right away if you have excessive vaginal bleeding or severe abdominal pain. This information is not intended to replace advice given to you by your health care provider. Make sure you discuss any questions you have with your health care provider. Document Released: 04/16/2013 Document Revised: 06/08/2017 Document Reviewed: 09/21/2016 Elsevier Patient Education  [place] Anesthesia, Adult, Care After This sheet gives you information about how to care for yourself  after your procedure. Your health care provider may also give you more specific instructions. If you have problems or questions, contact your health care provider. What can I expect after the procedure? After the procedure, the following side effects are common:  Pain or discomfort at the IV site.  Nausea.  Vomiting.  Sore throat.  Trouble concentrating.  Feeling cold or chills.  Weak or tired.  Sleepiness and fatigue.  Soreness and body aches. These side effects can affect parts of the body that were not involved in surgery. Follow these instructions at home:  For at least 24 hours after the procedure:  Have a responsible adult stay with you. It is important to have someone help care for you until you are awake and  alert.  Rest as needed.  Do not: ? Participate in activities in which you could fall or become injured. ? Drive. ? Use heavy machinery. ? Drink alcohol. ? Take sleeping pills or medicines that cause drowsiness. ? Make important decisions or sign legal documents. ? Take care of children on your own. Eating and drinking  Follow any instructions from your health care provider about eating or drinking restrictions.  When you feel hungry, start by eating small amounts of foods that are soft and easy to digest (bland), such as toast. Gradually return to your regular diet.  Drink enough fluid to keep your urine pale yellow.  If you vomit, rehydrate by drinking water, juice, or clear broth. General instructions  If you have sleep apnea, surgery and certain medicines can increase your risk for breathing problems. Follow instructions from your health care provider about wearing your sleep device: ? Anytime you are sleeping, including during daytime naps. ? While taking prescription pain medicines, sleeping medicines, or medicines that make you drowsy.  Return to your normal activities as told by your health care provider. Ask your health care provider what  activities are safe for you.  Take over-the-counter and prescription medicines only as told by your health care provider.  If you smoke, do not smoke without supervision.  Keep all follow-up visits as told by your health care provider. This is important. Contact a health care provider if:  You have nausea or vomiting that does not get better with medicine.  You cannot eat or drink without vomiting.  You have pain that does not get better with medicine.  You are unable to pass urine.  You develop a skin rash.  You have a fever.  You have redness around your IV site that gets worse. Get help right away if:  You have difficulty breathing.  You have chest pain.  You have blood in your urine or stool, or you vomit blood. Summary  After the procedure, it is common to have a sore throat or nausea. It is also common to feel tired.  Have a responsible adult stay with you for the first 24 hours after general anesthesia. It is important to have someone help care for you until you are awake and alert.  When you feel hungry, start by eating small amounts of foods that are soft and easy to digest (bland), such as toast. Gradually return to your regular diet.  Drink enough fluid to keep your urine pale yellow.  Return to your normal activities as told by your health care provider. Ask your health care provider what activities are safe for you. This information is not intended to replace advice given to you by your health care provider. Make sure you discuss any questions you have with your health care provider. Document Released: 10/02/2000 Document Revised: 06/29/2017 Document Reviewed: 02/09/2017 Elsevier Patient Education  2020 [employer]. How to Use Chlorhexidine for Bathing Chlorhexidine gluconate (CHG) is a germ-killing (antiseptic) solution that is used to clean the skin. It can get rid of the bacteria that normally live on the skin and can keep them away for about 24 hours. To  clean your skin with CHG, you may be given:  A CHG solution to use in the shower or as part of a sponge bath.  A prepackaged cloth that contains CHG. Cleaning your skin with CHG may help lower the risk for infection:  While you are staying in the intensive care unit of the hospital.  If  you have a vascular access, such as a central line, to provide short-term or long-term access to your veins.  If you have a catheter to drain urine from your bladder.  If you are on a ventilator. A ventilator is a machine that helps you breathe by moving air in and out of your lungs.  After surgery. What are the risks? Risks of using CHG include:  A skin reaction.  Hearing loss, if CHG gets in your ears.  Eye injury, if CHG gets in your eyes and is not rinsed out.  The CHG product catching fire. Make sure that you avoid smoking and flames after applying CHG to your skin. Do not use CHG:  If you have a chlorhexidine allergy or have previously reacted to chlorhexidine.  On babies younger than 75 months of age. How to use CHG solution  Use CHG only as told by your health care provider, and follow the instructions on the label.  Use the full amount of CHG as directed. Usually, this is one bottle. During a shower Follow these steps when using CHG solution during a shower (unless your health care provider gives you different instructions): 1. Start the shower. 2. Use your normal soap and shampoo to wash your face and hair. 3. Turn off the shower or move out of the shower stream. 4. Pour the CHG onto a clean washcloth. Do not use any type of brush or rough-edged sponge. 5. Starting at your neck, lather your body down to your toes. Make sure you follow these instructions: ? If you will be having surgery, pay special attention to the part of your body where you will be having surgery. Scrub this area for at least 1 minute. ? Do not use CHG on your head or face. If the solution gets into your ears or  eyes, rinse them well with water. ? Avoid your genital area. ? Avoid any areas of skin that have broken skin, cuts, or scrapes. ? Scrub your back and under your arms. Make sure to wash skin folds. 6. Let the lather sit on your skin for 1-2 minutes or as long as told by your health care provider. 7. Thoroughly rinse your entire body in the shower. Make sure that all body creases and crevices are rinsed well. 8. Dry off with a clean towel. Do not put any substances on your body afterward-such as powder, lotion, or perfume-unless you are told to do so by your health care provider. Only use lotions that are recommended by the manufacturer. 9. Put on clean clothes or pajamas. 10. If it is the night before your surgery, sleep in clean sheets.  During a sponge bath Follow these steps when using CHG solution during a sponge bath (unless your health care provider gives you different instructions): 1. Use your normal soap and shampoo to wash your face and hair. 2. Pour the CHG onto a clean washcloth. 3. Starting at your neck, lather your body down to your toes. Make sure you follow these instructions: ? If you will be having surgery, pay special attention to the part of your body where you will be having surgery. Scrub this area for at least 1 minute. ? Do not use CHG on your head or face. If the solution gets into your ears or eyes, rinse them well with water. ? Avoid your genital area. ? Avoid any areas of skin that have broken skin, cuts, or scrapes. ? Scrub your back and under your arms. Make sure  to wash skin folds. 4. Let the lather sit on your skin for 1-2 minutes or as long as told by your health care provider. 5. Using a different clean, wet washcloth, thoroughly rinse your entire body. Make sure that all body creases and crevices are rinsed well. 6. Dry off with a clean towel. Do not put any substances on your body afterward-such as powder, lotion, or perfume-unless you are told to do so by  your health care provider. Only use lotions that are recommended by the manufacturer. 7. Put on clean clothes or pajamas. 8. If it is the night before your surgery, sleep in clean sheets. How to use CHG prepackaged cloths  Only use CHG cloths as told by your health care provider, and follow the instructions on the label.  Use the CHG cloth on clean, dry skin.  Do not use the CHG cloth on your head or face unless your health care provider tells you to.  When washing with the CHG cloth: ? Avoid your genital area. ? Avoid any areas of skin that have broken skin, cuts, or scrapes. Before surgery Follow these steps when using a CHG cloth to clean before surgery (unless your health care provider gives you different instructions): 1. Using the CHG cloth, vigorously scrub the part of your body where you will be having surgery. Scrub using a back-and-forth motion for 3 minutes. The area on your body should be completely wet with CHG when you are done scrubbing. 2. Do not rinse. Discard the cloth and let the area air-dry. Do not put any substances on the area afterward, such as powder, lotion, or perfume. 3. Put on clean clothes or pajamas. 4. If it is the night before your surgery, sleep in clean sheets.  For general bathing Follow these steps when using CHG cloths for general bathing (unless your health care provider gives you different instructions). 1. Use a separate CHG cloth for each area of your body. Make sure you wash between any folds of skin and between your fingers and toes. Wash your body in the following order, switching to a new cloth after each step: ? The front of your neck, shoulders, and chest. ? Both of your arms, under your arms, and your hands. ? Your stomach and groin area, avoiding the genitals. ? Your right leg and foot. ? Your left leg and foot. ? The back of your neck, your back, and your buttocks. 2. Do not rinse. Discard the cloth and let the area air-dry. Do not put  any substances on your body afterward-such as powder, lotion, or perfume-unless you are told to do so by your health care provider. Only use lotions that are recommended by the manufacturer. 3. Put on clean clothes or pajamas. Contact a health care provider if:  Your skin gets irritated after scrubbing.  You have questions about using your solution or cloth. Get help right away if:  Your eyes become very red or swollen.  Your eyes itch badly.  Your skin itches badly and is red or swollen.  Your hearing changes.  You have trouble seeing.  You have swelling or tingling in your mouth or throat.  You have trouble breathing.  You swallow any chlorhexidine. Summary  Chlorhexidine gluconate (CHG) is a germ-killing (antiseptic) solution that is used to clean the skin. Cleaning your skin with CHG may help to lower your risk for infection.  You may be given CHG to use for bathing. It may be in a bottle or  in a prepackaged cloth to use on your skin. Carefully follow your health care provider's instructions and the instructions on the product label.  Do not use CHG if you have a chlorhexidine allergy.  Contact your health care provider if your skin gets irritated after scrubbing. This information is not intended to replace advice given to you by your health care provider. Make sure you discuss any questions you have with your health care provider. Document Released: 03/20/2012 Document Revised: 09/12/2018 Document Reviewed: 05/24/2017 Elsevier Patient Education  2020 [employer].

## 2019-05-16 ENCOUNTER — Other Ambulatory Visit (HOSPITAL_COMMUNITY)
Admission: RE | Admit: 2019-05-16 | Discharge: 2019-05-16 | Disposition: A | Discharge status: Home / Self Care | Payer: BC Managed Care – PPO | Source: Ambulatory Visit | Attending: Obstetrics and Gynecology | Admitting: Obstetrics and Gynecology

## 2019-05-16 ENCOUNTER — Encounter (HOSPITAL_COMMUNITY): Payer: Self-pay

## 2019-05-16 ENCOUNTER — Encounter (HOSPITAL_COMMUNITY)
Admission: RE | Admit: 2019-05-16 | Discharge: 2019-05-16 | Disposition: A | Discharge status: Home / Self Care | Payer: BC Managed Care – PPO | Source: Ambulatory Visit | Attending: Obstetrics and Gynecology | Admitting: Obstetrics and Gynecology

## 2019-05-16 ENCOUNTER — Other Ambulatory Visit: Payer: Self-pay

## 2019-05-16 DIAGNOSIS — Z01812 Encounter for preprocedural laboratory examination: Secondary | ICD-10-CM | POA: Diagnosis not present

## 2019-05-16 DIAGNOSIS — Z20828 Contact with and (suspected) exposure to other viral communicable diseases: Secondary | ICD-10-CM | POA: Diagnosis not present

## 2019-05-16 HISTORY — DX: Nausea with vomiting, unspecified: R11.2

## 2019-05-16 HISTORY — DX: Other specified postprocedural states: Z98.890

## 2019-05-16 LAB — SARS CORONAVIRUS 2 (TAT 6-24 HRS): SARS Coronavirus 2: NEGATIVE

## 2019-05-16 LAB — CBC WITH DIFFERENTIAL/PLATELET
Abs Immature Granulocytes: 0.01 10*3/uL (ref 0.00–0.07)
Basophils Absolute: 0.1 10*3/uL (ref 0.0–0.1)
Basophils Relative: 1 %
Eosinophils Absolute: 0.2 10*3/uL (ref 0.0–0.5)
Eosinophils Relative: 3 %
HCT: 45.4 % (ref 36.0–46.0)
Hemoglobin: 15.2 g/dL — ABNORMAL HIGH (ref 12.0–15.0)
Immature Granulocytes: 0 %
Lymphocytes Relative: 37 %
Lymphs Abs: 2.3 10*3/uL (ref 0.7–4.0)
MCH: 30.9 pg (ref 26.0–34.0)
MCHC: 33.5 g/dL (ref 30.0–36.0)
MCV: 92.3 fL (ref 80.0–100.0)
Monocytes Absolute: 0.5 10*3/uL (ref 0.1–1.0)
Monocytes Relative: 8 %
Neutro Abs: 3.2 10*3/uL (ref 1.7–7.7)
Neutrophils Relative %: 51 %
Platelets: 274 10*3/uL (ref 150–400)
RBC: 4.92 MIL/uL (ref 3.87–5.11)
RDW: 11.6 % (ref 11.5–15.5)
WBC: 6.2 10*3/uL (ref 4.0–10.5)
nRBC: 0 % (ref 0.0–0.2)

## 2019-05-16 LAB — TYPE AND SCREEN
ABO/RH(D): O POS
Antibody Screen: NEGATIVE

## 2019-05-16 LAB — COMPREHENSIVE METABOLIC PANEL
ALT: 30 U/L (ref 0–44)
AST: 17 U/L (ref 15–41)
Albumin: 4.2 g/dL (ref 3.5–5.0)
Alkaline Phosphatase: 54 U/L (ref 38–126)
Anion gap: 11 (ref 5–15)
BUN: 12 mg/dL (ref 6–20)
CO2: 20 mmol/L — ABNORMAL LOW (ref 22–32)
Calcium: 9.2 mg/dL (ref 8.9–10.3)
Chloride: 110 mmol/L (ref 98–111)
Creatinine, Ser: 0.5 mg/dL (ref 0.44–1.00)
GFR calc Af Amer: >60 mL/min (ref >60)
GFR calc non Af Amer: >60 mL/min (ref >60)
Glucose, Bld: 131 mg/dL — ABNORMAL HIGH (ref 70–99)
Potassium: 3.8 mmol/L (ref 3.5–5.1)
Sodium: 141 mmol/L (ref 135–145)
Total Bilirubin: 0.8 mg/dL (ref 0.3–1.2)
Total Protein: 7 g/dL (ref 6.5–8.1)

## 2019-05-16 LAB — GLUCOSE, CAPILLARY: Glucose-Capillary: 140 mg/dL — ABNORMAL HIGH (ref 70–99)

## 2019-05-16 LAB — HEMOGLOBIN A1C
Hgb A1c MFr Bld: 7.4 % — ABNORMAL HIGH (ref 4.8–5.6)
Mean Plasma Glucose: 165.68 mg/dL

## 2019-05-16 LAB — HCG, SERUM, QUALITATIVE: Preg, Serum: NEGATIVE

## 2019-05-19 NOTE — H&P (Addendum)
Dana Lynch is an 48 y.o. female. She is admitted for an abdominal supracervical hysterectomy. She desires ovarian preservation, and pros and cons of bilateral salpingectomy as a risk reduction strategy and she declines tube removal. She has symptomatic fibroids, primarily a single large fibroid 7 x 9 cm maximum diameter, with pressure effects. She is s/p endometrial ablation ,with no bleeding issues. She has no visible endometrial stripe .   She has no vasomotor symptoms.   She was originally scheduled for hysterectomy last month, but was postponed due to an elevated HGB A1C of 9.8, down from 14 last summer when DM meds were not being taken regularly. Recent A1C is 7.4. She declined the offer of Lupron preop as a way to shrink the uterus for reduced blood supply and pelvic access.   Pertinent Gynecological History: Menses: s/p ablation. Bleeding: none Contraception:  DES exposure: unknown Blood transfusions: none Sexually transmitted diseases: no past history Previous GYN Procedures: endometrial ablation  Last mammogram: normal Date: 06/24/2018 Last pap: normal Date: 03/03/2019 OB History: G, P  GYNECOLOGIC SONOGRAM   Dana Lynch is a 48 y.o. G3P3 s/p ablation,she is here for a pelvic sonogram for enlarged uterus.  Uterus                      13.7 x 9.1 x 10.4 cm, Total uterine volume 677 cc,enlarged heterogeneous anteverted uterus with a 9.2 x 9.4 x 7.5 cm fibroid mid uterus  Endometrium          Unable to visualize endometrium because of fibroid   Right ovary             2.8 x 1.8 x 2.7 cm, wnl,limited view   Left ovary                2.5 x 3.7 x 2.2 cm, wnl,appears to be mobile   No free fluid   Technician Comments:  PELVIC US TA/TV: enlarged heterogeneous anteverted uterus with a 9.2 x 9.4 x 7.5 cm fibroid mid uterus,unable to visualize the endometrium,normal ovaries bilat,limited view of right ovary,no free fluid,no pain during ultrasound    Chaperone: Dianne Dun 03/10/2019 5:00 PM  Clinical Impression and recommendations:  I have reviewed the sonogram results above, combined with the patient's current clinical course, below are my impressions and any appropriate recommendations for management based on the sonographic findings.  Uterus is enlarged with significant 10 cm myoma, that is distorting the endometrium, 50% larger than the study done in 2015 Ovaries are normal appear menopausal size   Florian Buff 03/10/2019 5:16 PM     Menstrual History: Menarche age:  No LMP recorded (lmp unknown). Patient is postmenopausal.    Past Medical History:  Diagnosis Date  . Bowel habit changes 12/22/2013   Alternates constipation and diarrhea. Will refer to GI   . Diabetes ([place]) 02/16/2014  . Fibroids 12/22/2013  . Hypertension   . PONV (postoperative nausea and vomiting)   . Unspecified symptom associated with female genital organs 12/22/2013    Past Surgical History:  Procedure Laterality Date  . APPENDECTOMY    . BREAST BIOPSY Left   . ENDOMETRIAL ABLATION    . TONSILLECTOMY AND ADENOIDECTOMY      Family History  Problem Relation Age of Onset  . Diabetes Mother   . Diabetes Maternal Grandmother   . Hypertension Maternal Grandmother   . Cancer Maternal Grandfather        prostate  .  Heart disease Paternal Grandmother   . Diabetes Paternal Grandmother   . Heart disease Paternal Grandfather     Social History:  reports that she has never smoked. She has never used smokeless tobacco. She reports current alcohol use. She reports that she does not use drugs.  Allergies: No Known Allergies  No medications prior to admission.    ROS  There were no vitals taken for this visit. Physical Exam  Constitutional: She is oriented to person, place, and time. She appears well-developed and well-nourished. No distress.  HENT:  Head: Normocephalic and atraumatic.  Neck: Normal range of motion.   Cardiovascular: Normal rate and regular rhythm.  Respiratory: Effort normal and breath sounds normal.  GI: Soft. Bowel sounds are normal. She exhibits no mass.  Enlarged uterus 12-14 wk size.moderate abdominal wall laxity. Discussed removal of a small ellipse of skin and sub q fat  to improve pelvic access as well as potentially improve incision line post procedure, pt agrees that this is desired.  Genitourinary:    Vagina normal.     No vaginal discharge.     Genitourinary Comments: 12-14 wk size uterus   Musculoskeletal: Normal range of motion.  Neurological: She is alert and oriented to person, place, and time. She has normal reflexes.  Skin: Skin is warm and dry.  Psychiatric: She has a normal mood and affect. Her behavior is normal. Judgment and thought content normal.    CBC    Component Value Date/Time   WBC 6.2 05/16/2019 1039   RBC 4.92 05/16/2019 1039   HGB 15.2 (H) 05/16/2019 1039   HGB 16.6 (H) 01/19/2017 0955   HCT 45.4 05/16/2019 1039   HCT 50.1 (H) 01/19/2017 0955   PLT 274 05/16/2019 1039   PLT 232 01/19/2017 0955   MCV 92.3 05/16/2019 1039   MCV 90 01/19/2017 0955   MCH 30.9 05/16/2019 1039   MCHC 33.5 05/16/2019 1039   RDW 11.6 05/16/2019 1039   RDW 12.8 01/19/2017 0955   LYMPHSABS 2.3 05/16/2019 1039   MONOABS 0.5 05/16/2019 1039   EOSABS 0.2 05/16/2019 1039   BASOSABS 0.1 05/16/2019 1039   CMP Latest Ref Rng & Units 05/16/2019 04/18/2019 03/05/2019  Glucose 70 - 99 mg/dL 131(H) 169(H) 390(H)  BUN 6 - 20 mg/dL 12 11 10   Creatinine 0.44 - 1.00 mg/dL 0.50 0.54 0.63  Sodium 135 - 145 mmol/L 141 140 136  Potassium 3.5 - 5.1 mmol/L 3.8 3.9 4.0  Chloride 98 - 111 mmol/L 110 111 102  CO2 22 - 32 mmol/L 20(L) 22 26  Calcium 8.9 - 10.3 mg/dL 9.2 8.8(L) 9.4  Total Protein 6.5 - 8.1 g/dL 7.0 6.9 6.5  Total Bilirubin 0.3 - 1.2 mg/dL 0.8 0.6 0.6  Alkaline Phos 38 - 126 U/L 54 64 -  AST 15 - 41 U/L 17 19 15   ALT 0 - 44 U/L 30 35 31(H)     No results  found.  Assessment/Plan: Symptomatic uterine fibroids, S/p endometrial ablation Diabetes mellitus  Plan : Abdominal supracervical hysterectomy.  Risks to adjacent organs reviewed  Dr Elonda Husky to assist.  Jonnie Kind 05/19/2019, 9:11 PM

## 2019-05-20 ENCOUNTER — Encounter (HOSPITAL_COMMUNITY): Payer: Self-pay

## 2019-05-20 ENCOUNTER — Other Ambulatory Visit: Payer: Self-pay

## 2019-05-20 ENCOUNTER — Encounter (HOSPITAL_COMMUNITY)
Admission: RE | Disposition: A | Discharge status: Home / Self Care | Payer: Self-pay | Source: Home / Self Care | Attending: Obstetrics and Gynecology

## 2019-05-20 ENCOUNTER — Encounter (HOSPITAL_COMMUNITY): Payer: Self-pay | Admitting: Anesthesiology

## 2019-05-20 ENCOUNTER — Inpatient Hospital Stay (HOSPITAL_COMMUNITY)
Admission: RE | Admit: 2019-05-20 | Discharge: 2019-05-21 | DRG: 743 | Disposition: A | Discharge status: Home / Self Care | Payer: BC Managed Care – PPO | Attending: Obstetrics and Gynecology | Admitting: Obstetrics and Gynecology

## 2019-05-20 DIAGNOSIS — D259 Leiomyoma of uterus, unspecified: Principal | ICD-10-CM | POA: Diagnosis present

## 2019-05-20 DIAGNOSIS — Z9049 Acquired absence of other specified parts of digestive tract: Secondary | ICD-10-CM

## 2019-05-20 DIAGNOSIS — I10 Essential (primary) hypertension: Secondary | ICD-10-CM | POA: Diagnosis present

## 2019-05-20 DIAGNOSIS — Z833 Family history of diabetes mellitus: Secondary | ICD-10-CM | POA: Diagnosis not present

## 2019-05-20 DIAGNOSIS — Z8249 Family history of ischemic heart disease and other diseases of the circulatory system: Secondary | ICD-10-CM

## 2019-05-20 DIAGNOSIS — E119 Type 2 diabetes mellitus without complications: Secondary | ICD-10-CM | POA: Diagnosis present

## 2019-05-20 DIAGNOSIS — Z9851 Tubal ligation status: Secondary | ICD-10-CM

## 2019-05-20 DIAGNOSIS — N854 Malposition of uterus: Secondary | ICD-10-CM | POA: Diagnosis present

## 2019-05-20 DIAGNOSIS — D250 Submucous leiomyoma of uterus: Secondary | ICD-10-CM

## 2019-05-20 DIAGNOSIS — Z9089 Acquired absence of other organs: Secondary | ICD-10-CM | POA: Diagnosis not present

## 2019-05-20 DIAGNOSIS — Z90711 Acquired absence of uterus with remaining cervical stump: Secondary | ICD-10-CM | POA: Diagnosis present

## 2019-05-20 DIAGNOSIS — D251 Intramural leiomyoma of uterus: Secondary | ICD-10-CM

## 2019-05-20 DIAGNOSIS — D219 Benign neoplasm of connective and other soft tissue, unspecified: Secondary | ICD-10-CM | POA: Diagnosis present

## 2019-05-20 HISTORY — PX: ABDOMINAL HYSTERECTOMY: SHX81

## 2019-05-20 LAB — GLUCOSE, CAPILLARY
Glucose-Capillary: 159 mg/dL — ABNORMAL HIGH (ref 70–99)
Glucose-Capillary: 176 mg/dL — ABNORMAL HIGH (ref 70–99)
Glucose-Capillary: 161 mg/dL — ABNORMAL HIGH (ref 70–99)
Glucose-Capillary: 179 mg/dL — ABNORMAL HIGH (ref 70–99)
Glucose-Capillary: 140 mg/dL — ABNORMAL HIGH (ref 70–99)

## 2019-05-20 SURGERY — HYSTERECTOMY, ABDOMINAL
Anesthesia: General | Site: Abdomen

## 2019-05-20 MED ORDER — METFORMIN HCL ER 500 MG PO TB24
500.0000 mg | ORAL_TABLET | Freq: Two times a day (BID) | ORAL | Status: DC
  Filled 2019-05-20: qty 1

## 2019-05-20 MED ORDER — LACTATED RINGERS IV SOLN
INTRAVENOUS | Status: DC
  Administered 2019-05-20 (×2): via INTRAVENOUS

## 2019-05-20 MED ORDER — FENTANYL CITRATE (PF) 250 MCG/5ML IJ SOLN
INTRAMUSCULAR | Status: AC
  Filled 2019-05-20: qty 5

## 2019-05-20 MED ORDER — OXYCODONE HCL 5 MG PO TABS
5.0000 mg | ORAL_TABLET | ORAL | Status: DC | PRN
  Administered 2019-05-20 – 2019-05-21 (×4): 5 mg via ORAL
  Filled 2019-05-20 (×4): qty 1

## 2019-05-20 MED ORDER — CEFAZOLIN SODIUM-DEXTROSE 2-4 GM/100ML-% IV SOLN
2.0000 g | INTRAVENOUS | Status: AC
  Administered 2019-05-20: 2 g via INTRAVENOUS
  Filled 2019-05-20: qty 100

## 2019-05-20 MED ORDER — ENOXAPARIN SODIUM 40 MG/0.4ML ~~LOC~~ SOLN
40.0000 mg | SUBCUTANEOUS | Status: DC
  Administered 2019-05-21: 40 mg via SUBCUTANEOUS
  Filled 2019-05-20: qty 0.4

## 2019-05-20 MED ORDER — MIDAZOLAM HCL 2 MG/2ML IJ SOLN
INTRAMUSCULAR | Status: AC
  Filled 2019-05-20: qty 2

## 2019-05-20 MED ORDER — BUPIVACAINE LIPOSOME 1.3 % IJ SUSP
INTRAMUSCULAR | Status: DC | PRN
  Administered 2019-05-20: 17 mL

## 2019-05-20 MED ORDER — 0.9 % SODIUM CHLORIDE (POUR BTL) OPTIME
TOPICAL | Status: DC | PRN
  Administered 2019-05-20 (×4): 1000 mL

## 2019-05-20 MED ORDER — KETOROLAC TROMETHAMINE 30 MG/ML IJ SOLN
30.0000 mg | Freq: Once | INTRAMUSCULAR | Status: AC
  Administered 2019-05-20: 30 mg via INTRAVENOUS
  Filled 2019-05-20: qty 1

## 2019-05-20 MED ORDER — LIDOCAINE HCL (CARDIAC) PF 100 MG/5ML IV SOSY
PREFILLED_SYRINGE | INTRAVENOUS | Status: DC | PRN
  Administered 2019-05-20: 100 mg via INTRAVENOUS

## 2019-05-20 MED ORDER — SUGAMMADEX SODIUM 200 MG/2ML IV SOLN
INTRAVENOUS | Status: DC | PRN
  Administered 2019-05-20: 200 mg via INTRAVENOUS

## 2019-05-20 MED ORDER — BUPIVACAINE LIPOSOME 1.3 % IJ SUSP
INTRAMUSCULAR | Status: AC
  Filled 2019-05-20: qty 20

## 2019-05-20 MED ORDER — HYDROCODONE-ACETAMINOPHEN 7.5-325 MG PO TABS
1.0000 | ORAL_TABLET | Freq: Once | ORAL | Status: AC | PRN
  Administered 2019-05-20: 1 via ORAL
  Filled 2019-05-20: qty 1

## 2019-05-20 MED ORDER — MIDAZOLAM HCL 5 MG/5ML IJ SOLN
INTRAMUSCULAR | Status: DC | PRN
  Administered 2019-05-20 (×2): 1 mg via INTRAVENOUS

## 2019-05-20 MED ORDER — PROPOFOL 10 MG/ML IV BOLUS
INTRAVENOUS | Status: AC
  Filled 2019-05-20: qty 40

## 2019-05-20 MED ORDER — PROMETHAZINE HCL 25 MG/ML IJ SOLN: 6.2500 mg | INTRAMUSCULAR | Status: DC | PRN

## 2019-05-20 MED ORDER — SODIUM CHLORIDE 0.9% FLUSH: 9.0000 mL | INTRAVENOUS | Status: DC | PRN

## 2019-05-20 MED ORDER — DIPHENHYDRAMINE HCL 50 MG/ML IJ SOLN: 12.5000 mg | Freq: Four times a day (QID) | INTRAMUSCULAR | Status: DC | PRN

## 2019-05-20 MED ORDER — HYDROMORPHONE 1 MG/ML IV SOLN: INTRAVENOUS | Status: DC

## 2019-05-20 MED ORDER — KETOROLAC TROMETHAMINE 30 MG/ML IJ SOLN
INTRAMUSCULAR | Status: AC
  Filled 2019-05-20: qty 1

## 2019-05-20 MED ORDER — DEXAMETHASONE SODIUM PHOSPHATE 10 MG/ML IJ SOLN
INTRAMUSCULAR | Status: AC
  Filled 2019-05-20: qty 1

## 2019-05-20 MED ORDER — ROCURONIUM BROMIDE 10 MG/ML (PF) SYRINGE
PREFILLED_SYRINGE | INTRAVENOUS | Status: DC | PRN
  Administered 2019-05-20: 60 mg via INTRAVENOUS
  Administered 2019-05-20: 10 mg via INTRAVENOUS

## 2019-05-20 MED ORDER — ONDANSETRON HCL 4 MG/2ML IJ SOLN: 4.0000 mg | Freq: Four times a day (QID) | INTRAMUSCULAR | Status: DC | PRN

## 2019-05-20 MED ORDER — FENTANYL CITRATE (PF) 100 MCG/2ML IJ SOLN
INTRAMUSCULAR | Status: DC | PRN
  Administered 2019-05-20: 100 ug via INTRAVENOUS
  Administered 2019-05-20: 50 ug via INTRAVENOUS
  Administered 2019-05-20: 100 ug via INTRAVENOUS
  Administered 2019-05-20: 50 ug via INTRAVENOUS
  Administered 2019-05-20: 100 ug via INTRAVENOUS
  Administered 2019-05-20 (×2): 50 ug via INTRAVENOUS

## 2019-05-20 MED ORDER — BUPIVACAINE HCL (PF) 0.5 % IJ SOLN
INTRAMUSCULAR | Status: AC
  Filled 2019-05-20: qty 60

## 2019-05-20 MED ORDER — DIPHENHYDRAMINE HCL 12.5 MG/5ML PO ELIX: 12.5000 mg | ORAL_SOLUTION | Freq: Four times a day (QID) | ORAL | Status: DC | PRN

## 2019-05-20 MED ORDER — HYDROMORPHONE HCL 1 MG/ML IJ SOLN
0.2500 mg | INTRAMUSCULAR | Status: DC | PRN
  Administered 2019-05-20 (×4): 0.5 mg via INTRAVENOUS
  Filled 2019-05-20 (×4): qty 0.5

## 2019-05-20 MED ORDER — ONDANSETRON HCL 4 MG/2ML IJ SOLN
INTRAMUSCULAR | Status: DC | PRN
  Administered 2019-05-20: 4 mg via INTRAVENOUS

## 2019-05-20 MED ORDER — INSULIN ASPART 100 UNIT/ML ~~LOC~~ SOLN
0.0000 [IU] | Freq: Three times a day (TID) | SUBCUTANEOUS | Status: DC
  Administered 2019-05-20 – 2019-05-21 (×3): 3 [IU] via SUBCUTANEOUS
  Filled 2019-05-20: qty 0.15

## 2019-05-20 MED ORDER — ONDANSETRON HCL 4 MG PO TABS: 4.0000 mg | ORAL_TABLET | Freq: Four times a day (QID) | ORAL | Status: DC | PRN

## 2019-05-20 MED ORDER — ONDANSETRON HCL 4 MG/2ML IJ SOLN
INTRAMUSCULAR | Status: AC
  Filled 2019-05-20: qty 2

## 2019-05-20 MED ORDER — DEXAMETHASONE SODIUM PHOSPHATE 10 MG/ML IJ SOLN
INTRAMUSCULAR | Status: DC | PRN
  Administered 2019-05-20: 5 mg via INTRAVENOUS

## 2019-05-20 MED ORDER — PANTOPRAZOLE SODIUM 40 MG PO TBEC
40.0000 mg | DELAYED_RELEASE_TABLET | Freq: Every day | ORAL | Status: DC
  Administered 2019-05-21: 40 mg via ORAL
  Filled 2019-05-20: qty 1

## 2019-05-20 MED ORDER — HEMOSTATIC AGENTS (NO CHARGE) OPTIME
TOPICAL | Status: DC | PRN
  Administered 2019-05-20: 1 via TOPICAL

## 2019-05-20 MED ORDER — LISINOPRIL 5 MG PO TABS
5.0000 mg | ORAL_TABLET | Freq: Every day | ORAL | Status: DC
  Administered 2019-05-21: 5 mg via ORAL
  Filled 2019-05-20: qty 1

## 2019-05-20 MED ORDER — MIDAZOLAM HCL 2 MG/2ML IJ SOLN: 0.5000 mg | Freq: Once | INTRAMUSCULAR | Status: DC | PRN

## 2019-05-20 MED ORDER — NALOXONE HCL 0.4 MG/ML IJ SOLN: 0.4000 mg | INTRAMUSCULAR | Status: DC | PRN

## 2019-05-20 MED ORDER — PROPOFOL 10 MG/ML IV BOLUS
INTRAVENOUS | Status: DC | PRN
  Administered 2019-05-20: 200 mg via INTRAVENOUS

## 2019-05-20 SURGICAL SUPPLY — 52 items
APL SKNCLS STERI-STRIP NONHPOA (GAUZE/BANDAGES/DRESSINGS) ×1
BENZOIN TINCTURE PRP APPL 2/3 (GAUZE/BANDAGES/DRESSINGS) ×2 IMPLANT
CLOSURE WOUND 1/2 X4 (GAUZE/BANDAGES/DRESSINGS) ×2
CLOTH BEACON ORANGE TIMEOUT ST (SAFETY) ×3 IMPLANT
COVER LIGHT HANDLE STERIS (MISCELLANEOUS) ×6 IMPLANT
COVER WAND RF STERILE (DRAPES) ×2 IMPLANT
DRAPE WARM FLUID 44X44 (DRAPES) ×3 IMPLANT
DRSG OPSITE POSTOP 4X10 (GAUZE/BANDAGES/DRESSINGS) ×2 IMPLANT
DRSG OPSITE POSTOP 4X8 (GAUZE/BANDAGES/DRESSINGS) ×2 IMPLANT
ELECT REM PT RETURN 9FT ADLT (ELECTROSURGICAL) ×3
ELECTRODE REM PT RTRN 9FT ADLT (ELECTROSURGICAL) ×1 IMPLANT
GAUZE SPONGE 4X4 16PLY XRAY LF (GAUZE/BANDAGES/DRESSINGS) ×3 IMPLANT
GLOVE BIO SURGEON STRL SZ7 (GLOVE) ×2 IMPLANT
GLOVE BIOGEL PI IND STRL 7.0 (GLOVE) ×2 IMPLANT
GLOVE BIOGEL PI IND STRL 9 (GLOVE) ×1 IMPLANT
GLOVE BIOGEL PI INDICATOR 7.0 (GLOVE) ×8
GLOVE BIOGEL PI INDICATOR 9 (GLOVE) ×2
GLOVE ECLIPSE 6.5 STRL STRAW (GLOVE) ×4 IMPLANT
GLOVE ECLIPSE 8.0 STRL XLNG CF (GLOVE) ×2 IMPLANT
GLOVE ECLIPSE 9.0 STRL (GLOVE) ×3 IMPLANT
GOWN SPEC L3 XXLG W/TWL (GOWN DISPOSABLE) ×3 IMPLANT
GOWN STRL REUS W/TWL LRG LVL3 (GOWN DISPOSABLE) ×6 IMPLANT
GOWN STRL REUS W/TWL XL LVL3 (GOWN DISPOSABLE) ×2 IMPLANT
HEMOSTAT ARISTA ABSORB 3G PWDR (HEMOSTASIS) ×2 IMPLANT
INST SET MAJOR GENERAL (KITS) ×3 IMPLANT
KIT BLADEGUARD II DBL (SET/KITS/TRAYS/PACK) ×2 IMPLANT
KIT TURNOVER KIT A (KITS) ×3 IMPLANT
MANIFOLD NEPTUNE II (INSTRUMENTS) ×3 IMPLANT
NDL HYPO 18GX1.5 BLUNT FILL (NEEDLE) IMPLANT
NEEDLE HYPO 18GX1.5 BLUNT FILL (NEEDLE) ×3 IMPLANT
NEEDLE HYPO 22GX1.5 SAFETY (NEEDLE) ×2 IMPLANT
NS IRRIG 1000ML POUR BTL (IV SOLUTION) ×10 IMPLANT
PACK ABDOMINAL MAJOR (CUSTOM PROCEDURE TRAY) ×3 IMPLANT
PAD ARMBOARD 7.5X6 YLW CONV (MISCELLANEOUS) ×3 IMPLANT
RETRACTOR WND ALEXIS 25 LRG (MISCELLANEOUS) IMPLANT
RTRCTR WOUND ALEXIS 25CM LRG (MISCELLANEOUS) ×3
SET BASIN LINEN APH (SET/KITS/TRAYS/PACK) ×3 IMPLANT
SPONGE LAP 18X18 RF (DISPOSABLE) ×6 IMPLANT
STRIP CLOSURE SKIN 1/2X4 (GAUZE/BANDAGES/DRESSINGS) ×2 IMPLANT
SUT CHROMIC 0 CT 1 (SUTURE) ×24 IMPLANT
SUT CHROMIC 2 0 CT 1 (SUTURE) ×3 IMPLANT
SUT MNCRL+ AB 3-0 CT1 36 (SUTURE) IMPLANT
SUT MONOCRYL AB 3-0 CT1 36IN (SUTURE) ×2
SUT PLAIN CT 1/2CIR 2-0 27IN (SUTURE) ×5 IMPLANT
SUT VIC AB 0 CT1 27 (SUTURE) ×9
SUT VIC AB 0 CT1 27XBRD ANTBC (SUTURE) IMPLANT
SUT VIC AB 0 CT1 27XCR 8 STRN (SUTURE) IMPLANT
SUT VICRYL 4 0 KS 27 (SUTURE) ×3 IMPLANT
SYR 20ML LL LF (SYRINGE) ×2 IMPLANT
SYR 30ML LL (SYRINGE) ×2 IMPLANT
TOWEL SURG RFD BLUE STRL DISP (DISPOSABLE) ×3 IMPLANT
TRAY FOLEY MTR SLVR 16FR STAT (SET/KITS/TRAYS/PACK) ×3 IMPLANT

## 2019-05-20 NOTE — Progress Notes (Signed)
Foley catheter removed per order. Emptied 750 ml of clear, yellow urine from drainage bag. After foley catheter removal, patient ambulated to the bathroom and urinated 200 ml of clear, yellow urine. Patient tolerated well.

## 2019-05-20 NOTE — Anesthesia Postprocedure Evaluation (Signed)
Anesthesia Post Note  Patient: Dana Lynch  Procedure(s) Performed: SUPRACERVICAL ABDOMINAL HYSTERECTOMY WITH LEFT SALPINGECTOMY (N/A Abdomen)  Patient location during evaluation: PACU Anesthesia Type: General Level of consciousness: awake and alert, oriented and patient cooperative Pain management: pain level controlled Vital Signs Assessment: post-procedure vital signs reviewed and stable Respiratory status: spontaneous breathing Cardiovascular status: stable Postop Assessment: no apparent nausea or vomiting Anesthetic complications: no     Last Vitals:  Vitals:   05/20/19 0958 05/20/19 1000  BP: 121/77   Pulse: 69 71  Resp: 10 10  Temp:    SpO2: 99% 99%    Last Pain:  Vitals:   05/20/19 0958  TempSrc:   PainSc: 4                  ADAMS, AMY A

## 2019-05-20 NOTE — Anesthesia Preprocedure Evaluation (Signed)
Anesthesia Evaluation  Patient identified by MRN, date of birth, ID band Patient awake    Reviewed: Allergy & Precautions, NPO status , Patient's Chart, lab work & pertinent test results  History of Anesthesia Complications (+) PONV, PROLONGED EMERGENCE and history of anesthetic complications  Airway Mallampati: III  TM Distance: >3 FB Neck ROM: Full    Dental no notable dental hx. (+) Teeth Intact   Pulmonary neg pulmonary ROS,    Pulmonary exam normal breath sounds clear to auscultation       Cardiovascular Exercise Tolerance: Good hypertension, Pt. on medications negative cardio ROS Normal cardiovascular examI Rhythm:Regular Rate:Normal     Neuro/Psych negative neurological ROS  negative psych ROS   GI/Hepatic negative GI ROS, Neg liver ROS,   Endo/Other  negative endocrine ROSdiabetes, Type 2, Oral Hypoglycemic Agents  Renal/GU negative Renal ROS  negative genitourinary   Musculoskeletal negative musculoskeletal ROS (+)   Abdominal   Peds negative pediatric ROS (+)  Hematology negative hematology ROS (+)   Anesthesia Other Findings   Reproductive/Obstetrics negative OB ROS                             Anesthesia Physical Anesthesia Plan  ASA: II  Anesthesia Plan: General   Post-op Pain Management:    Induction: Intravenous  PONV Risk Score and Plan: 4 or greater and Midazolam, Ondansetron, Dexamethasone and Treatment may vary due to age or medical condition  Airway Management Planned: Oral ETT  Additional Equipment:   Intra-op Plan:   Post-operative Plan: Extubation in OR  Informed Consent: I have reviewed the patients History and Physical, chart, labs and discussed the procedure including the risks, benefits and alternatives for the proposed anesthesia with the patient or authorized representative who has indicated his/her understanding and acceptance.     Dental  advisory given  Plan Discussed with: CRNA  Anesthesia Plan Comments: (Plan Full PPE use Plan GETA D/W PT -WTP with same after Q&A)        Anesthesia Quick Evaluation

## 2019-05-20 NOTE — Transfer of Care (Signed)
Immediate Anesthesia Transfer of Care Note  Patient: Dana Lynch  Procedure(s) Performed: SUPRACERVICAL ABDOMINAL HYSTERECTOMY WITH LEFT SALPINGECTOMY (N/A Abdomen)  Patient Location: PACU  Anesthesia Type:General  Level of Consciousness: awake, alert  and oriented  Airway & Oxygen Therapy: Patient Spontanous Breathing and Patient connected to nasal cannula oxygen  Post-op Assessment: Report given to RN, Post -op Vital signs reviewed and stable and Patient moving all extremities X 4  Post vital signs: Reviewed and stable  Last Vitals:  Vitals Value Taken Time  BP 131/83 05/20/19 0919  Temp    Pulse 86 05/20/19 0923  Resp 15 05/20/19 0923  SpO2 99 % 05/20/19 0923  Vitals shown include unvalidated device data.  Last Pain:  Vitals:   05/20/19 0653  TempSrc: Oral  PainSc:          Complications: No apparent anesthesia complications

## 2019-05-20 NOTE — Op Note (Signed)
05/20/2019  9:24 AM  PATIENT:  Dana Lynch  48 y.o. female  PRE-OPERATIVE DIAGNOSIS:  Fibroid uterus, symptomatic  POST-OPERATIVE DIAGNOSIS:  Fibroid uterus, symptomatic, removed  PROCEDURE:  Procedure(s): SUPRACERVICAL ABDOMINAL HYSTERECTOMY WITH LEFT SALPINGECTOMY (N/A)  SURGEON:  Surgeon(s) and Role:    Jonnie Kind, MD - Primary    * Eure, Mertie Clause, MD - Assisting  PHYSICIAN ASSISTANT:   ASSISTANTS: Zoila Shutter, CST  ANESTHESIA:   local, general and Exparel placed in fascia and subcutaneous skin  EBL:  100 mL   BLOOD ADMINISTERED:none  DRAINS: Urinary Catheter (Foley)   LOCAL MEDICATIONS USED:  OTHER Exparel  SPECIMEN:  Source of Specimen:  Uterus supracervical, left fallopian tube  DISPOSITION OF SPECIMEN:  PATHOLOGY  COUNTS:  YES  TOURNIQUET:  * No tourniquets in log *  DICTATION: .Dragon Dictation  PLAN OF CARE: Admit to inpatient   PATIENT DISPOSITION:  PACU - hemodynamically stable.   Delay start of Pharmacological VTE agent (>24hrs) due to surgical blood loss or risk of bleeding: not applicable  Details of procedure: Patient was taken the operating room prepped and draped for lower abdominal surgery.  Timeout was conducted and procedure confirmed by operative team.  Antibiotics were administered.  A transverse lower abdominal incision that had been marked in the preop holding area was performed excising a 20 cm long ellipse of skin and subcutaneous fatty tissue from just inside the anterior superior iliac crest on each side to the midline.  Hemostasis was achieved with point cautery as necessary.  Fascia was opened transversely.  Bowel was packed away.  The uterus was mobile round and globular.  Ovaries appeared normal as did fallopian tubes.  Evidence of prior tubal sterilization with Falope-Rings were present. Attention was directed first to the left side where the round ligament was clamped ligated and transected.  A window through the left  broad ligament was made bluntly and then the utero-ovarian ligament and fallopian tube proximal stump clamped with curved Kelly clamp, as well as a Heaney clamp for backbleeding.  The pedicle was ligated.  There was some oozing from the tube in this area that required an additional ligation. The uterine vessels on the left side were first addressed.  The bladder flap was developed on the anterior lower uterine segment sharply, followed by crossclamping the broad ligament and uterine vessels, which were cut ligated with 0 Vicryl, followed by clamping the upper cardinal ligament with a straight Heaney clamp knife dissection and suture ligature. The patient's right side was treated in a similar fashion.  Upon completion of the dissection ligation the dissection had a had been taken down to just above the insertion of the uterosacral ligaments posteriorly on the cervix, at which time the bladder was made sure to be mobilized inferiorly on the anterior cervix X gotten out of the way completely, and then a conical excision of the lower uterine segment off of the cervical stump was performed.  Figure-of-eight sutures front to back x4 were placed to achieve hemostasis and closure of the cervical stump there is a small bit of oozing that was addressed with Arista, after irrigation of the pelvis and inspection of the pedicles.  The left fallopian tube wanted to use more than was considered desirable so left salpingectomy was performed by placing a Kelly clamp across the mesosalpinx, excising the fallopian tube and then ligating the pedicle with 0 Vicryl.  Hemostasis was confirmed is adequate.  Laparotomy equipment was removed and sponge and  needle counts were correct. At this point I proceeded with closure of the peritoneum, followed by reapproximation of the fascia.  I injected Exparel 20 cc in the lateral aspects of the incision at the fascial level as well as at the ends of the skin incision.  The fascia was then closed  in a continuous running fashion with 0 Vicryl.  Hemostasis was good.  The subcu fatty tissues were irrigated confirmed as hemostatic, then reapproximated with 6 horizontal mattress sutures of 2-0 plain followed by subcuticular 4-0 Vicryl closure of the skin.  Steri-Strips and benzoin were applied and patient recovery room in excellent condition with EBL 100 cc.  Wand confirmation of sponge count was performed and was correct.  Honeycomb dressing had been applied.  Patient recovery room, where she will stay until beds are available later today

## 2019-05-20 NOTE — Anesthesia Procedure Notes (Signed)
Procedure Name: Intubation Date/Time: 05/20/2019 7:28 AM Performed by: Lyda Jester, CRNA Pre-anesthesia Checklist: Patient identified, Patient being monitored, Timeout performed, Emergency Drugs available and Suction available Patient Re-evaluated:Patient Re-evaluated prior to induction Oxygen Delivery Method: Circle System Utilized Preoxygenation: Pre-oxygenation with 100% oxygen Induction Type: IV induction Ventilation: Mask ventilation without difficulty Laryngoscope Size: Miller and 2 Grade View: Grade II Tube type: Oral Tube size: 7.5 mm Number of attempts: 1 Airway Equipment and Method: stylet Placement Confirmation: ETT inserted through vocal cords under direct vision,  positive ETCO2 and breath sounds checked- equal and bilateral Secured at: 22 cm Tube secured with: Tape Dental Injury: Teeth and Oropharynx as per pre-operative assessment

## 2019-05-20 NOTE — Progress Notes (Signed)
Day of Surgery Procedure(s) (LRB): SUPRACERVICAL ABDOMINAL HYSTERECTOMY WITH LEFT SALPINGECTOMY (N/A)  Subjective: Patient reports incisional pain.  At a 2/10 managed by oral oxycodone Phone review of condition through her nurse. Objective: I have reviewed patient's vital signs and intake and output.    Assessment: s/p Procedure(s): SUPRACERVICAL ABDOMINAL HYSTERECTOMY WITH LEFT SALPINGECTOMY (N/A): stable and progressing well  Plan: Encourage ambulation offer to d/c foley tonight,   LOS: 0 days    Jonnie Kind 05/20/2019, 8:07 PM

## 2019-05-20 NOTE — Interval H&P Note (Signed)
History and Physical Interval Note:  05/20/2019 6:52 AM  Dana Lynch  has presented today for surgery, with the diagnosis of Fibroids.  The various methods of treatment have been discussed with the patient and family. After consideration of risks, benefits and other options for treatment, the patient has consented to  Procedure(s): HYSTERECTOMY ABDOMINAL (N/A) as a surgical intervention.  The patient's history has been reviewed, patient examined, no change in status, stable for surgery.  I have reviewed the patient's chart and labs.  Questions were answered to the patient's satisfaction.     Jonnie Kind

## 2019-05-20 NOTE — Brief Op Note (Addendum)
05/20/2019  9:24 AM  PATIENT:  Dana Lynch  48 y.o. female  PRE-OPERATIVE DIAGNOSIS:  Fibroid uterus, symptomatic  POST-OPERATIVE DIAGNOSIS:  Fibroid uterus, symptomatic, removed  PROCEDURE:  Procedure(s): SUPRACERVICAL ABDOMINAL HYSTERECTOMY WITH LEFT SALPINGECTOMY (N/A)  SURGEON:  Surgeon(s) and Role:    Jonnie Kind, MD - Primary    * Eure, Mertie Clause, MD - Assisting  PHYSICIAN ASSISTANT:   ASSISTANTS: Zoila Shutter, CST  ANESTHESIA:   local, general and Exparel placed in fascia and subcutaneous skin  EBL:  100 mL   BLOOD ADMINISTERED:none  DRAINS: Urinary Catheter (Foley)   LOCAL MEDICATIONS USED:  OTHER Exparel  SPECIMEN:  Source of Specimen:  Uterus supracervical, left fallopian tube  DISPOSITION OF SPECIMEN:  PATHOLOGY  COUNTS:  YES  TOURNIQUET:  * No tourniquets in log *  DICTATION: .Dragon Dictation  PLAN OF CARE: Admit to inpatient   PATIENT DISPOSITION:  PACU - hemodynamically stable.   Delay start of Pharmacological VTE agent (>24hrs) due to surgical blood loss or risk of bleeding: not applicable  Details of procedure: Patient was taken the operating room prepped and draped for lower abdominal surgery.  Timeout was conducted and procedure confirmed by operative team.  Antibiotics were administered.  A transverse lower abdominal incision that had been marked in the preop holding area was performed excising a 20 cm long ellipse of skin and subcutaneous fatty tissue from just inside the anterior superior iliac crest on each side to the midline.  Hemostasis was achieved with point cautery as necessary.  Fascia was opened transversely.  Bowel was packed away.  The uterus was mobile round and globular.  Ovaries appeared normal as did fallopian tubes.  Evidence of prior tubal sterilization with Falope-Rings were present. Attention was directed first to the left side where the round ligament was clamped ligated and transected.  A window through the left  broad ligament was made bluntly and then the utero-ovarian ligament and fallopian tube proximal stump clamped with curved Kelly clamp, as well as a Heaney clamp for backbleeding.  The pedicle was ligated.  There was some oozing from the tube in this area that required an additional ligation. The uterine vessels on the left side were first addressed.  The bladder flap was developed on the anterior lower uterine segment sharply, followed by crossclamping the broad ligament and uterine vessels, which were cut ligated with 0 Vicryl, followed by clamping the upper cardinal ligament with a straight Heaney clamp knife dissection and suture ligature. The patient's right side was treated in a similar fashion.  Upon completion of the dissection ligation the dissection had a had been taken down to just above the insertion of the uterosacral ligaments posteriorly on the cervix, at which time the bladder was made sure to be mobilized inferiorly on the anterior cervix X gotten out of the way completely, and then a conical excision of the lower uterine segment off of the cervical stump was performed.  Figure-of-eight sutures front to back x4 were placed to achieve hemostasis and closure of the cervical stump there is a small bit of oozing that was addressed with Arista, after irrigation of the pelvis and inspection of the pedicles.  The left fallopian tube wanted to use more than was considered desirable so left salpingectomy was performed by placing a Kelly clamp across the mesosalpinx, excising the fallopian tube and then ligating the pedicle with 0 Vicryl.  Hemostasis was confirmed is adequate.  Laparotomy equipment was removed and sponge and  needle counts were correct. At this point I proceeded with closure of the peritoneum, followed by reapproximation of the fascia.  I injected Exparel 20 cc in the lateral aspects of the incision at the fascial level as well as at the ends of the skin incision.  The fascia was then closed  in a continuous running fashion with 0 Vicryl.  Hemostasis was good.  The subcu fatty tissues were irrigated confirmed as hemostatic, then reapproximated with 6 horizontal mattress sutures of 2-0 plain followed by subcuticular 4-0 Vicryl closure of the skin.  Steri-Strips and benzoin were applied and patient recovery room in excellent condition with EBL 100 cc.  Wand confirmation of sponge count was performed and was correct.  Honeycomb dressing had been applied.  Patient recovery room, where she will stay until beds are available later today

## 2019-05-21 ENCOUNTER — Encounter (HOSPITAL_COMMUNITY): Payer: Self-pay | Admitting: Obstetrics and Gynecology

## 2019-05-21 LAB — BASIC METABOLIC PANEL
Anion gap: 9 (ref 5–15)
BUN: 12 mg/dL (ref 6–20)
CO2: 23 mmol/L (ref 22–32)
Calcium: 9.1 mg/dL (ref 8.9–10.3)
Chloride: 105 mmol/L (ref 98–111)
Creatinine, Ser: 0.58 mg/dL (ref 0.44–1.00)
GFR calc Af Amer: >60 mL/min (ref >60)
GFR calc non Af Amer: >60 mL/min (ref >60)
Glucose, Bld: 178 mg/dL — ABNORMAL HIGH (ref 70–99)
Potassium: 3.7 mmol/L (ref 3.5–5.1)
Sodium: 137 mmol/L (ref 135–145)

## 2019-05-21 LAB — CBC
HCT: 43 % (ref 36.0–46.0)
Hemoglobin: 13.9 g/dL (ref 12.0–15.0)
MCH: 30.2 pg (ref 26.0–34.0)
MCHC: 32.3 g/dL (ref 30.0–36.0)
MCV: 93.3 fL (ref 80.0–100.0)
Platelets: 276 10*3/uL (ref 150–400)
RBC: 4.61 MIL/uL (ref 3.87–5.11)
RDW: 11.6 % (ref 11.5–15.5)
WBC: 13.7 10*3/uL — ABNORMAL HIGH (ref 4.0–10.5)
nRBC: 0 % (ref 0.0–0.2)

## 2019-05-21 LAB — SURGICAL PATHOLOGY

## 2019-05-21 LAB — GLUCOSE, CAPILLARY: Glucose-Capillary: 160 mg/dL — ABNORMAL HIGH (ref 70–99)

## 2019-05-21 MED ORDER — CIPROFLOXACIN HCL 500 MG PO TABS: 500.0000 mg | ORAL_TABLET | Freq: Two times a day (BID) | ORAL | 0 refills | Status: DC

## 2019-05-21 MED ORDER — OXYCODONE HCL 5 MG PO TABS: 5.0000 mg | ORAL_TABLET | ORAL | 0 refills | Status: DC | PRN

## 2019-05-21 MED ORDER — METFORMIN HCL ER 500 MG PO TB24
500.0000 mg | ORAL_TABLET | Freq: Two times a day (BID) | ORAL | Status: DC
  Administered 2019-05-21: 500 mg via ORAL
  Filled 2019-05-21: qty 1

## 2019-05-21 MED ORDER — BISACODYL 10 MG RE SUPP: 10.0000 mg | Freq: Every day | RECTAL | Status: DC | PRN

## 2019-05-21 MED ORDER — GLIPIZIDE ER 5 MG PO TB24
5.0000 mg | ORAL_TABLET | Freq: Every day | ORAL | Status: DC
  Administered 2019-05-21: 5 mg via ORAL
  Filled 2019-05-21: qty 1

## 2019-05-21 MED ORDER — BISACODYL 10 MG RE SUPP: 10.0000 mg | Freq: Every day | RECTAL | 0 refills | Status: DC | PRN

## 2019-05-21 NOTE — Progress Notes (Signed)
Pt passed small clot while urinating. Urine clear yellow and no dysuria. Pt stated she could feel the clot passing but was unsure if if was from her urethra or vagina. Denies any other bleeding/blood. Paged Dr. Glo Herring at patient's request to ensure that passing clot was normal after her type of surgery. MD responded via secure chat that some bleeding and occasional clot was normal after surgery, especially as she starts to be more active. Information relayed to pt and husband, both stated understanding. Pt discharged via wheelchair to [facility] with husband.

## 2019-05-21 NOTE — Progress Notes (Signed)
1 Day Post-Op Procedure(s) (LRB): SUPRACERVICAL ABDOMINAL HYSTERECTOMY WITH LEFT SALPINGECTOMY (N/A)  Subjective: Patient reports incisional pain, tolerating PO and no problems voiding.   Limited flatus, no bm. Some sensation of bloating. Objective: I have reviewed patient's vital signs, medications and labs. CBC    Component Value Date/Time   WBC 13.7 (H) 05/21/2019 0557   RBC 4.61 05/21/2019 0557   HGB 13.9 05/21/2019 0557   HGB 16.6 (H) 01/19/2017 0955   HCT 43.0 05/21/2019 0557   HCT 50.1 (H) 01/19/2017 0955   PLT 276 05/21/2019 0557   PLT 232 01/19/2017 0955   MCV 93.3 05/21/2019 0557   MCV 90 01/19/2017 0955   MCH 30.2 05/21/2019 0557   MCHC 32.3 05/21/2019 0557   RDW 11.6 05/21/2019 0557   RDW 12.8 01/19/2017 0955   LYMPHSABS 2.3 05/16/2019 1039   MONOABS 0.5 05/16/2019 1039   EOSABS 0.2 05/16/2019 1039   BASOSABS 0.1 05/16/2019 1039   BMET    Component Value Date/Time   NA 137 05/21/2019 0557   NA 139 05/21/2017 1208   K 3.7 05/21/2019 0557   CL 105 05/21/2019 0557   CO2 23 05/21/2019 0557   GLUCOSE 178 (H) 05/21/2019 0557   BUN 12 05/21/2019 0557   BUN 15 05/21/2017 1208   CREATININE 0.58 05/21/2019 0557   CREATININE 0.63 03/05/2019 0733   CALCIUM 9.1 05/21/2019 0557   GFRNONAA >60 05/21/2019 0557   GFRNONAA 105 03/05/2019 0733   GFRAA >60 05/21/2019 0557   GFRAA 121 03/05/2019 0733    General: alert, cooperative and no distress Resp: clear to auscultation bilaterally Cardio: regular rate and rhythm GI: abnormal findings:  hypoactive bowel sounds and incision: clean and dry  Assessment: s/p Procedure(s): SUPRACERVICAL ABDOMINAL HYSTERECTOMY WITH LEFT SALPINGECTOMY (N/A): stable  Plan: Advance diet Discharge home add antibiotics as precaution , dulcolax suppos prn, oxycodone for d/c pain  LOS: 1 day    Dana Lynch 05/21/2019, 8:23 AM

## 2019-05-21 NOTE — Progress Notes (Signed)
Discharge instructions given.  Pt verbalized understanding of f/u appts and meds.  Also verbalized understanding of new meds to be picked up at pharmacy.

## 2019-05-21 NOTE — Discharge Instructions (Signed)
Abdominal Hysterectomy Abdominal hysterectomy is a surgical procedure to remove the womb (uterus). The uterus is the muscular organ that houses a developing baby. This surgery may be done if:  You have cancer.  You have growths (tumors or fibroids) in the uterus.  You have long-term (chronic) pain.  You are bleeding.  Your uterus has slipped down into your vagina (uterine prolapse).  You have a condition in which the tissue that lines the uterus grows outside of its normal location (endometriosis).  You have an infection in your uterus.  You are having problems with your menstrual cycle. Depending on why you are having this procedure, you may also have other reproductive organs removed. These could include:  The part of your vagina that connects with your uterus (cervix).  The organs that make eggs (ovaries).  The tubes that connect the ovaries to the uterus (fallopian tubes). Tell a health care provider about:  Any allergies you have.  All medicines you are taking, including vitamins, herbs, eye drops, creams, and over-the-counter medicines.  Any problems you or family members have had with anesthetic medicines.  Any blood disorders you have.  Any surgeries you have had.  Any medical conditions you have.  Whether you are pregnant or may be pregnant. What are the risks? Generally, this is a safe procedure. However, problems may occur, including:  Bleeding.  Infection.  Allergic reactions to medicines or dyes.  Damage to other structures or organs.  Nerve injury.  Decreased interest in sex or pain during sex.  Blood clots that can break free and travel to your lungs. What happens before the procedure? Staying hydrated Follow instructions from your health care provider about hydration, which may include:  Up to 2 hours before the procedure - you may continue to drink clear liquids, such as water, clear fruit juice, black coffee, and plain tea Eating and  drinking restrictions Follow instructions from your health care provider about eating and drinking, which may include:  8 hours before the procedure - stop eating heavy meals or foods such as meat, fried foods, or fatty foods.  6 hours before the procedure - stop eating light meals or foods, such as toast or cereal.  6 hours before the procedure - stop drinking milk or drinks that contain milk.  2 hours before the procedure - stop drinking clear liquids. Medicines  Ask your health care provider about: ? Changing or stopping your regular medicines. This is especially important if you are taking diabetes medicines or blood thinners. ? Taking medicines such as aspirin and ibuprofen. These medicines can thin your blood. Do not take these medicines before your procedure if your health care provider instructs you not to.  You may be given antibiotic medicine to help prevent infection. Take it as told by your health care provider.  You may be asked to take laxatives to prevent constipation. General instructions  Ask your health care provider how your surgical site will be marked or identified.  You may be asked to shower with a germ-killing soap.  Plan to have someone take you home from the hospital.  Do not use any products that contain nicotine or tobacco, such as cigarettes and e-cigarettes. If you need help quitting, ask your health care provider.  You may have an exam or testing.  You may have a blood or urine sample taken.  You may need to have an enema to clean out your rectum and lower colon.  This procedure can affect the way   you feel about yourself. Talk to your health care provider about the physical and emotional changes this procedure may cause. What happens during the procedure?  To lower your risk of infection: ? Your health care team will wash or sanitize their hands. ? Your skin will be washed with soap. ? Hair may be removed from the surgical area.  An IV tube  will be inserted into one of your veins.  You will be given one or more of the following: ? A medicine to help you relax (sedative). ? A medicine to make you fall asleep (general anesthetic).  Tight-fitting (compression) stockings will be placed on your legs to promote circulation.  A thin, flexible tube (catheter) will be inserted to help drain your urine.  The surgeon will make a cut (incision) through the skin in your lower belly. The incision may go side-to-side or up-and-down.  The surgeon will move aside the body tissue that covers your uterus. The surgeon will then carefully take out your uterus along with any of the other organs that need to be removed.  Bleeding will be controlled with clamps or sutures.  The surgeon will close your incision with stitches (sutures), skin glue, or adhesive strips.  A bandage (dressing) will be placed over the incision. The procedure may vary among health care providers and hospitals. What happens after the procedure?  You will be given pain medicine as needed.  Your blood pressure, heart rate, breathing rate, and blood oxygen level will be monitored until the medicines you were given have worn off.  You will need to stay in the hospital to recover for one to two days. Ask your health care provider how long you will need to stay in the hospital after your procedure.  You may have a liquid diet at first. You will most likely return to your usual diet the day after surgery.  You will still have the urinary catheter in place. It will likely be removed the day after surgery.  You may have to wear compression stockings. These stockings help to prevent blood clots and reduce swelling in your legs.  You will be encouraged to walk as soon as possible. You will also use a device or do breathing exercises to keep your lungs clear.  You may need to use a sanitary napkin for vaginal discharge. Summary  Abdominal hysterectomy is a surgical procedure  to remove the womb (uterus). The uterus is the muscular organ that houses a developing baby.  This procedure can affect the way you feel about yourself. Talk to your health care provider about the physical and emotional changes this procedure may cause.  You will be given medicines for pain after the procedure.  You will need to stay in the hospital to recover. Ask your health care provider how long you will need to stay in the hospital after your procedure. This information is not intended to replace advice given to you by your health care provider. Make sure you discuss any questions you have with your health care provider. Document Released: 07/01/2013 Document Revised: 07/31/2018 Document Reviewed: 06/14/2016 [employer] Patient Education  2020 [employer]  

## 2019-05-21 NOTE — Discharge Summary (Signed)
Physician Discharge Summary  Patient ID: Dana Lynch MRN: [id number] DOB/AGE: 1971-05-09 48 y.o.  Admit date: 05/20/2019 Discharge date: 05/21/2019  Admission Diagnoses: Symptomatic uterine fibroids Diabetes mellitus type 2 Discharge Diagnoses:  Active Problems:   Fibroids   Uterine fibroid   Status post abdominal supracervical subtotal hysterectomy   Discharged Condition: good  Hospital Course: This 48 year old diabetic female with a 9 x 7 cm fibroid resulting in pelvic pressure was admitted for abdominal hysterectomy, supracervical.  She desired preservation of the ovaries.  She was status post endometrial ablation in the past.  She was having no menses.  She was having pressure symptoms. She underwent abdominal supracervical hysterectomy.  The left tube was removed due to bleeding.  Procedure was uncomplicated and is documented in the operative note with 100 cc EBL.  Postoperative hemoglobin was excellent and stable.  She stayed in overnight for pain management.  She was ambulatory that evening, discontinued the Foley that evening, was considered stable for discharge the following morning tolerating diet, diabetic diet, having resumed her Metformin and glipizide. She will be seen in the office in 6 days for dressing removal and incision check Consults: None  Significant Diagnostic Studies: labs:  CBC Latest Ref Rng & Units 05/21/2019 05/16/2019 04/18/2019  WBC 4.0 - 10.5 K/uL 13.7(H) 6.2 7.0  Hemoglobin 12.0 - 15.0 g/dL 13.9 15.2(H) 15.2(H)  Hematocrit 36.0 - 46.0 % 43.0 45.4 46.6(H)  Platelets 150 - 400 K/uL 276 274 270   CMP Latest Ref Rng & Units 05/21/2019 05/16/2019 04/18/2019  Glucose 70 - 99 mg/dL 178(H) 131(H) 169(H)  BUN 6 - 20 mg/dL 12 12 11   Creatinine 0.44 - 1.00 mg/dL 0.58 0.50 0.54  Sodium 135 - 145 mmol/L 137 141 140  Potassium 3.5 - 5.1 mmol/L 3.7 3.8 3.9  Chloride 98 - 111 mmol/L 105 110 111  CO2 22 - 32 mmol/L 23 20(L) 22  Calcium 8.9 - 10.3 mg/dL 9.1  9.2 8.8(L)  Total Protein 6.5 - 8.1 g/dL - 7.0 6.9  Total Bilirubin 0.3 - 1.2 mg/dL - 0.8 0.6  Alkaline Phos 38 - 126 U/L - 54 64  AST 15 - 41 U/L - 17 19  ALT 0 - 44 U/L - 30 35     Treatments: surgery: Abdominal supracervical hysterectomy left salpingectomy  Discharge Exam: Blood pressure 111/69, pulse 83, temperature 99 F (37.2 C), temperature source Oral, resp. rate 18, height 5' 8"  (1.727 m), weight 94.3 kg, SpO2 95 %. General appearance: alert, cooperative, appears stated age, no distress and moderately obese Head: Normocephalic, without obvious abnormality, atraumatic Eyes: conjunctivae/corneas clear. PERRL, EOM's intact. Fundi benign. Resp: clear to auscultation bilaterally GI: soft, non-tender; bowel sounds normal; no masses,  no organomegaly and Bowel sounds were actually slightly hypoactive Incision/Wound: Clean dry and intact with no oozing into the dressing Legs negative for DVTs Disposition: Discharge disposition: 01-Home or Self Care       Discharge Instructions    Call MD for:  persistant nausea and vomiting   Complete by: As directed    Call MD for:  severe uncontrolled pain   Complete by: As directed    Call MD for:  temperature >100.4   Complete by: As directed    Diet - low sodium heart healthy   Complete by: As directed    Discharge instructions   Complete by: As directed    Dr Glo Herring may be reached this week on his cell at [phone]   Discontinue IV   Complete  by: As directed    Increase activity slowly   Complete by: As directed      Allergies as of 05/21/2019   No Known Allergies     Medication List    TAKE these medications   bisacodyl 10 MG suppository Commonly known as: DULCOLAX Place 1 suppository (10 mg total) rectally daily as needed for moderate constipation.   blood glucose meter kit and supplies Kit Dispense based on patient and insurance preference. Use up to four times daily as directed. (FOR ICD-10 E11.65)    Cholecalciferol 125 MCG (5000 UT) capsule Take 1 capsule (5,000 Units total) by mouth daily. What changed: when to take this   ciprofloxacin 500 MG tablet Commonly known as: Cipro Take 1 tablet (500 mg total) by mouth 2 (two) times daily.   glipiZIDE 5 MG 24 hr tablet Commonly known as: GLUCOTROL XL Take 2 tablets (10 mg total) by mouth daily with breakfast.   lisinopril 5 MG tablet Commonly known as: ZESTRIL TAKE 1 TABLET(5 MG) BY MOUTH DAILY   metFORMIN 500 MG 24 hr tablet Commonly known as: Glucophage XR Take 1 tablet (500 mg total) by mouth 2 (two) times daily after a meal.   OneTouch Verio test strip Generic drug: glucose blood Use as instructed   OneTouch Verio w/Device Kit 1 each by Does not apply route as needed.   oxyCODONE 5 MG immediate release tablet Commonly known as: Oxy IR/ROXICODONE Take 1-2 tablets (5-10 mg total) by mouth every 4 (four) hours as needed for moderate pain or severe pain.   simvastatin 20 MG tablet Commonly known as: ZOCOR Take 1 tablet (20 mg total) by mouth daily at 6 PM.      Follow-up Information    Jonnie Kind, MD Follow up in 6 day(s).   Specialties: Obstetrics and Gynecology, Radiology Contact information: [place] [address] [phone]           Signed: Jonnie Kind 05/21/2019, 8:34 AM

## 2019-05-27 ENCOUNTER — Ambulatory Visit (INDEPENDENT_AMBULATORY_CARE_PROVIDER_SITE_OTHER): Payer: BC Managed Care – PPO | Admitting: Obstetrics and Gynecology

## 2019-05-27 ENCOUNTER — Other Ambulatory Visit: Payer: Self-pay

## 2019-05-27 ENCOUNTER — Encounter: Payer: Self-pay | Admitting: Obstetrics and Gynecology

## 2019-05-27 VITALS — BP 118/85 | HR 95 | Ht 69.0 in | Wt 215.2 lb

## 2019-05-27 DIAGNOSIS — Z9889 Other specified postprocedural states: Secondary | ICD-10-CM

## 2019-05-27 DIAGNOSIS — Z09 Encounter for follow-up examination after completed treatment for conditions other than malignant neoplasm: Secondary | ICD-10-CM

## 2019-05-27 MED ORDER — HYDROCHLOROTHIAZIDE 25 MG PO TABS: 25.0000 mg | ORAL_TABLET | Freq: Every day | ORAL | 0 refills | Status: AC

## 2019-05-27 NOTE — Progress Notes (Signed)
   Subjective:  Dana Lynch is a 48 y.o. female now 1 weeks status post supracervical hysterectomy. And salpingectomy   Pt notes some puffiness in abdomen. Review of Systems Negative except    Diet:   regular   Bowel movements : normal.  The patient is not having any pain.  Objective:  BP 118/85 (BP Location: Right Arm, Patient Position: Sitting, Cuff Size: Large)   Pulse 95   Ht 5\' 9"  (1.753 m)   Wt 215 lb 3.2 oz (97.6 kg)   LMP  (LMP Unknown)   BMI 31.78 kg/m  General:Well developed, well nourished.  No acute distress. Abdomen: Bowel sounds normal, soft, non-tender. Pelvic Exam:     Incision(s):   Healing well, dressing removed, no drainage, no erythema, no hernia, no swelling, no dehiscence,}     Assessment:  Post-Op 1 weeks s/p supracervical hysterectomy   stable postoperatively.   Plan:  1.Wound care discussed   2. . current medications.added HCTZ x 1 month 3. Activity restrictions: gradual increase in activity  4. return to work: 4 weeks. 5. Follow up in 4 weeks.

## 2019-05-28 ENCOUNTER — Other Ambulatory Visit: Payer: Self-pay | Admitting: Obstetrics and Gynecology

## 2019-05-28 DIAGNOSIS — Z1231 Encounter for screening mammogram for malignant neoplasm of breast: Secondary | ICD-10-CM

## 2019-06-23 ENCOUNTER — Ambulatory Visit: Payer: BC Managed Care – PPO | Admitting: "Endocrinology

## 2019-07-01 ENCOUNTER — Other Ambulatory Visit: Payer: Self-pay | Admitting: "Endocrinology

## 2019-07-01 ENCOUNTER — Other Ambulatory Visit: Payer: Self-pay | Admitting: Adult Health

## 2019-07-02 MED ORDER — DOXYCYCLINE HYCLATE 100 MG PO CAPS: 100.0000 mg | ORAL_CAPSULE | Freq: Two times a day (BID) | ORAL | 0 refills | Status: DC

## 2019-07-02 NOTE — Telephone Encounter (Signed)
Pt called in , having a light amount of blood in her vaginal discharge" and some left sided pain.  I suspect this is internal oozing from the surgery that has found it's way thru the cervical os. Will Rx antibiotics x 1 wk to reduce chance of ascending infection from vagina. Pt has appt Tuesday. Will keep appt if not significantly improved.

## 2019-07-07 ENCOUNTER — Other Ambulatory Visit: Payer: Self-pay | Admitting: *Deleted

## 2019-07-08 ENCOUNTER — Ambulatory Visit
Admission: RE | Admit: 2019-07-08 | Discharge: 2019-07-08 | Disposition: A | Discharge status: Home / Self Care | Payer: BC Managed Care – PPO | Source: Ambulatory Visit | Attending: Obstetrics and Gynecology | Admitting: Obstetrics and Gynecology

## 2019-07-08 ENCOUNTER — Ambulatory Visit: Payer: BC Managed Care – PPO | Admitting: Obstetrics & Gynecology

## 2019-07-08 ENCOUNTER — Other Ambulatory Visit: Payer: Self-pay

## 2019-07-08 DIAGNOSIS — Z1231 Encounter for screening mammogram for malignant neoplasm of breast: Secondary | ICD-10-CM

## 2019-07-17 ENCOUNTER — Ambulatory Visit: Payer: BC Managed Care – PPO | Admitting: "Endocrinology

## 2019-07-29 ENCOUNTER — Ambulatory Visit: Payer: BC Managed Care – PPO | Attending: Internal Medicine

## 2019-07-29 ENCOUNTER — Other Ambulatory Visit: Payer: Self-pay

## 2019-07-29 DIAGNOSIS — Z20822 Contact with and (suspected) exposure to covid-19: Secondary | ICD-10-CM

## 2019-07-31 LAB — NOVEL CORONAVIRUS, NAA: SARS-CoV-2, NAA: NOT DETECTED

## 2019-10-07 ENCOUNTER — Other Ambulatory Visit: Payer: Self-pay | Admitting: "Endocrinology

## 2020-02-17 IMAGING — MG DIGITAL SCREENING BILATERAL MAMMOGRAM WITH TOMO AND CAD
8 series · 8 of 24 positions shown · non-contrast
Comparison: Previous exam(s).

CLINICAL DATA: Screening.

EXAM:
DIGITAL SCREENING BILATERAL MAMMOGRAM WITH TOMO AND CAD

[L CC synth-2D]
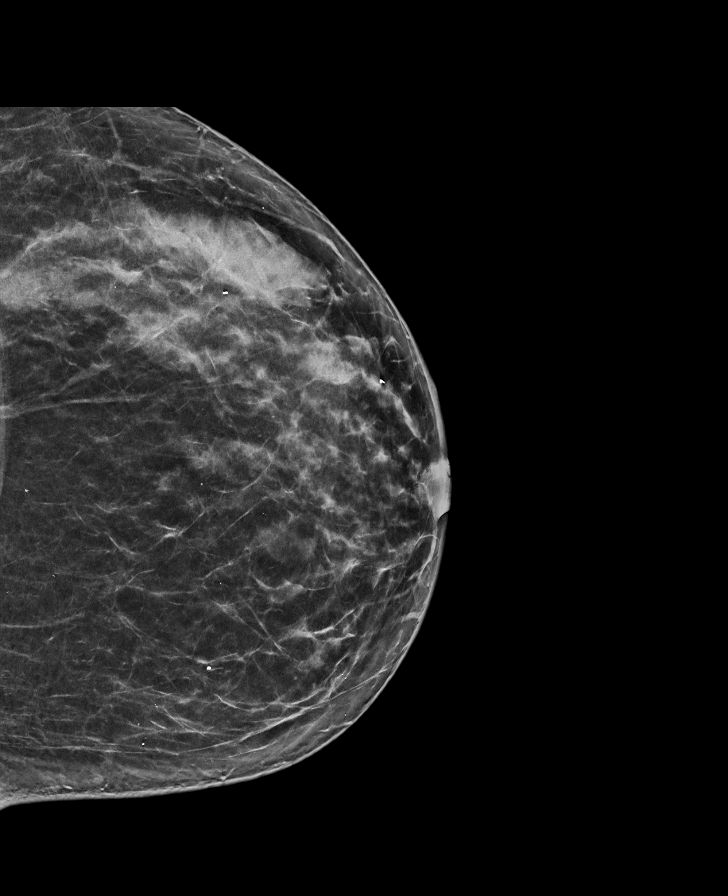

[L MLO synth-2D]
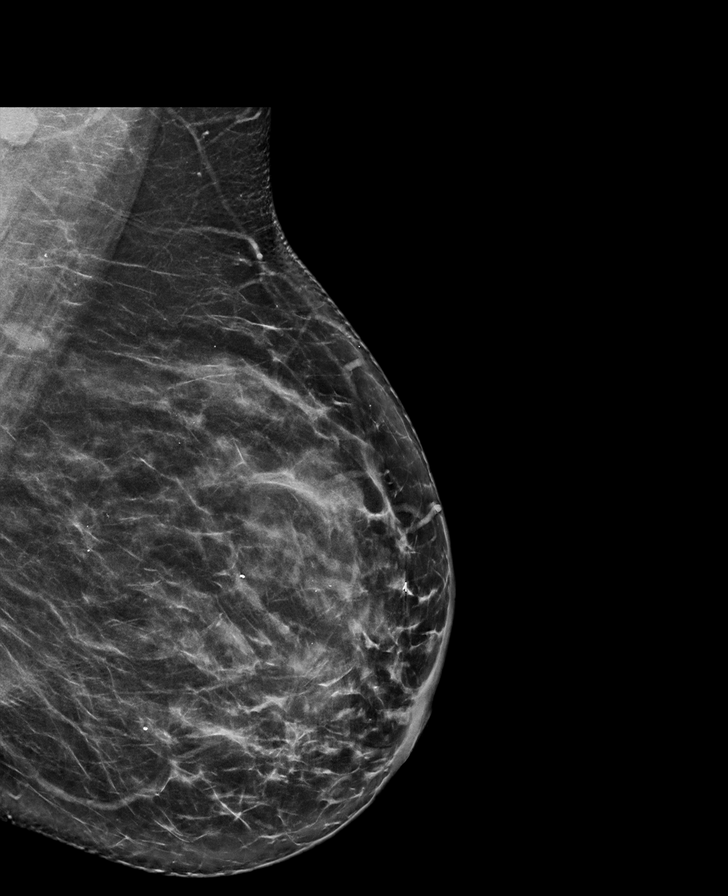

[R CC synth-2D]
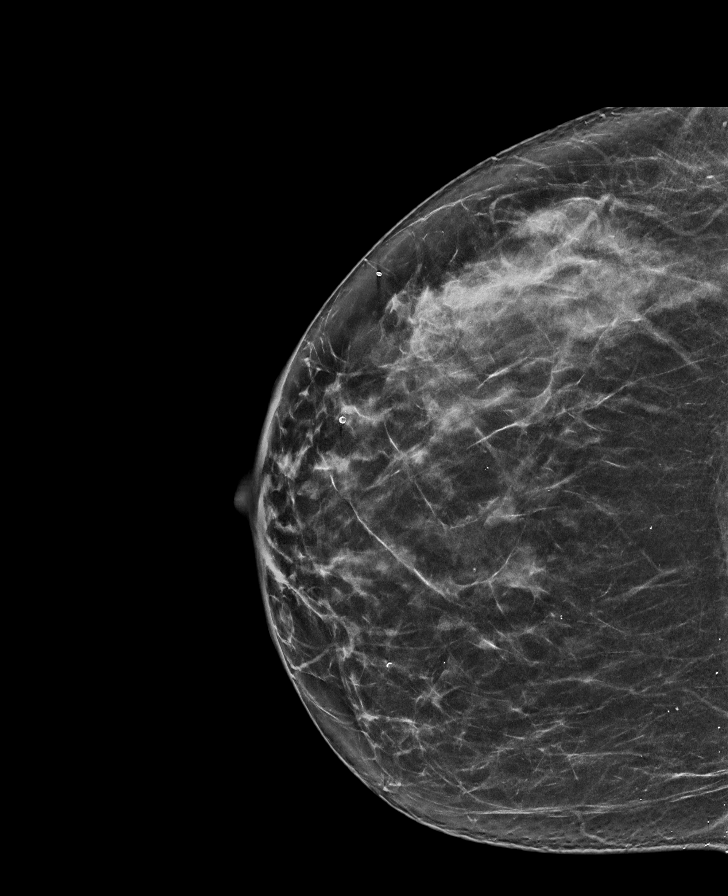

[R MLO synth-2D]
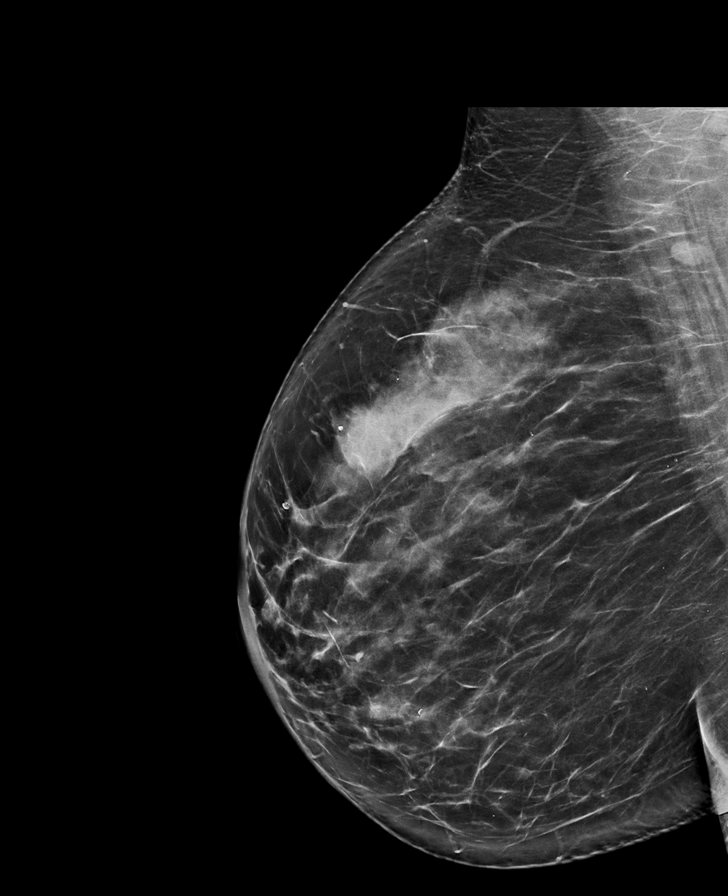

[R MLO tomo · tomo slice 47/92.0]
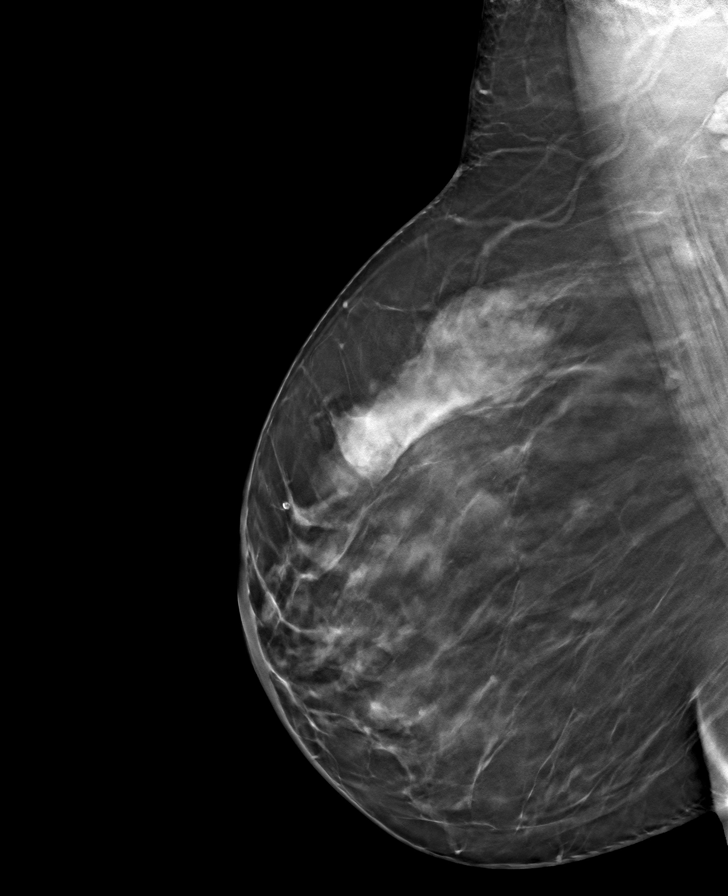

[R CC tomo · tomo slice 41/80.0]
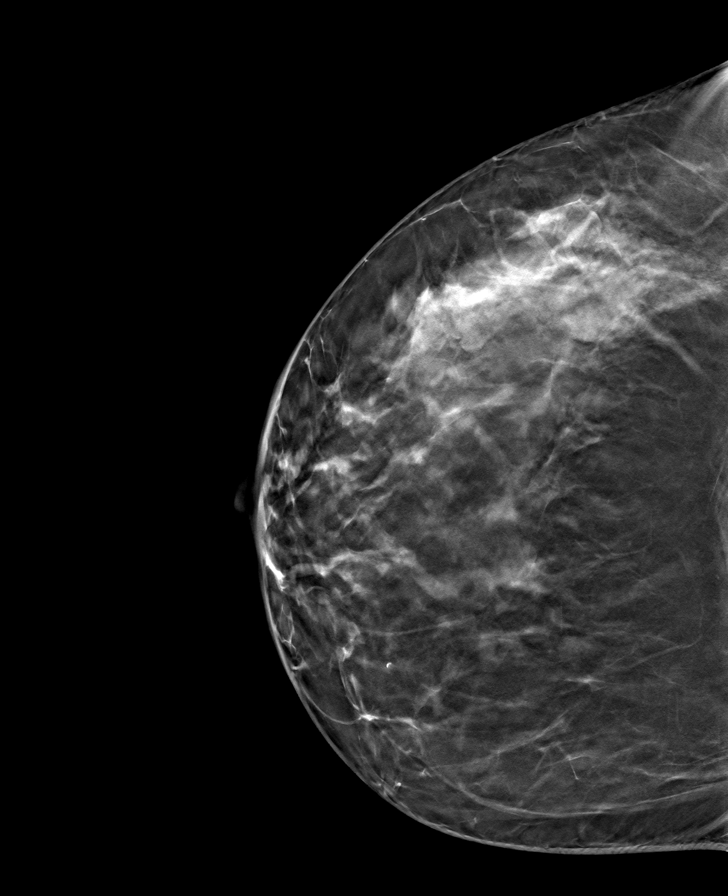

[L CC tomo · tomo slice 41/80.0]
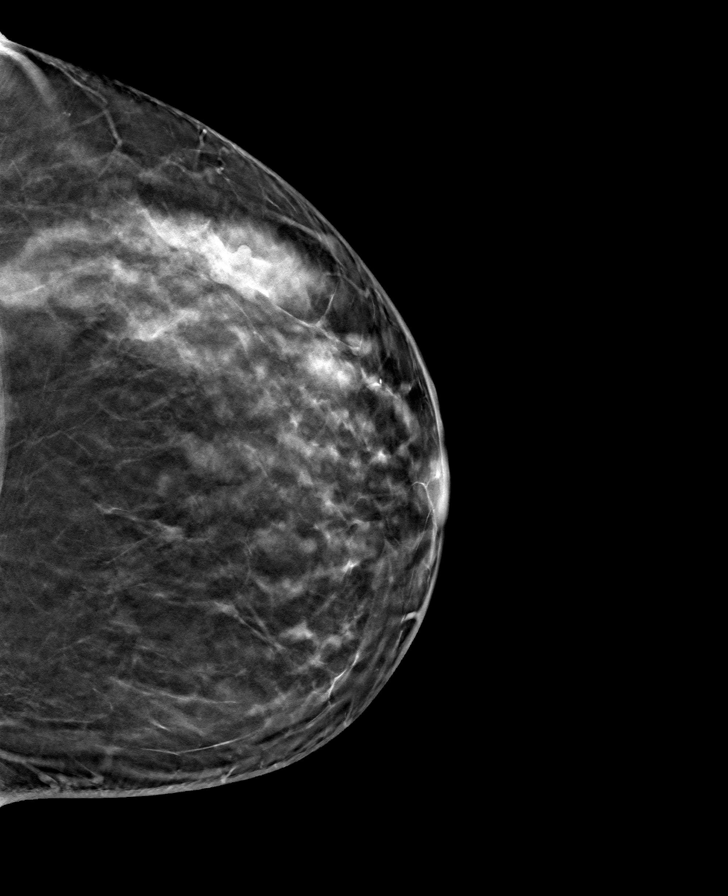

[L MLO tomo · tomo slice 44/87.0]
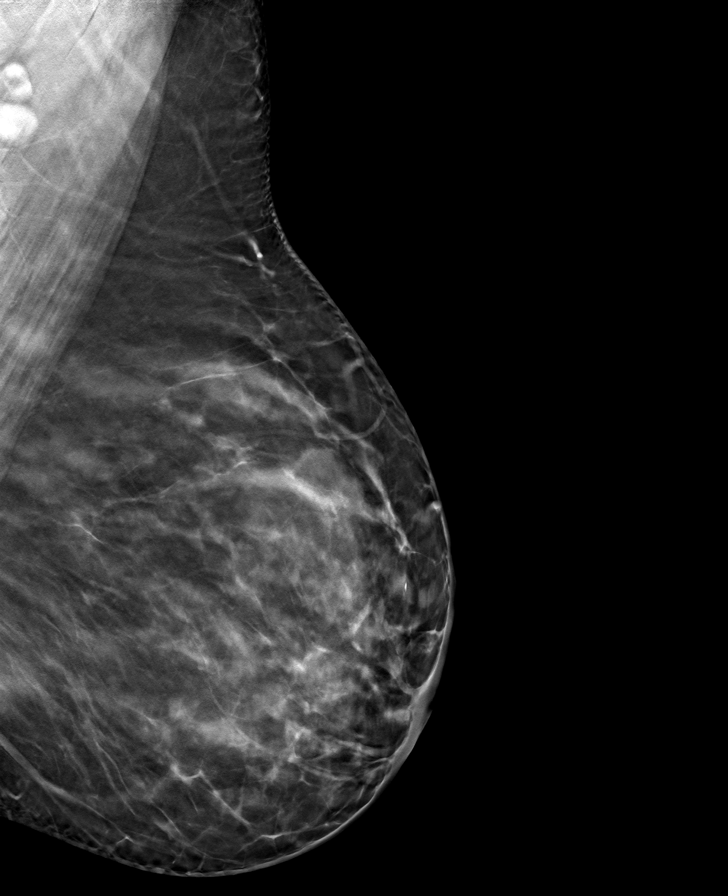

[8 of 24 positions shown; findings below may reference images not displayed]

ACR Breast Density Category c: The breast tissue is heterogeneously
dense, which may obscure small masses.
FINDINGS: In the left breast, a possible asymmetry warrants further
evaluation. In the right breast, no findings suspicious for
malignancy. Images were processed with CAD.
IMPRESSION: Further evaluation is suggested for possible asymmetry in the left
breast.

RECOMMENDATION:
Diagnostic mammogram and possibly ultrasound of the left breast.
(Code:[id number])

The patient will be contacted regarding the findings, and additional
imaging will be scheduled.

BI-RADS CATEGORY  0: Incomplete. Need additional imaging evaluation
and/or prior mammograms for comparison.

## 2020-05-19 ENCOUNTER — Other Ambulatory Visit: Payer: Self-pay | Admitting: Internal Medicine

## 2020-05-19 DIAGNOSIS — Z1231 Encounter for screening mammogram for malignant neoplasm of breast: Secondary | ICD-10-CM

## 2020-07-08 ENCOUNTER — Ambulatory Visit
Admission: RE | Admit: 2020-07-08 | Discharge: 2020-07-08 | Disposition: A | Discharge status: Home / Self Care | Payer: BC Managed Care – PPO | Source: Ambulatory Visit | Attending: Internal Medicine | Admitting: Internal Medicine

## 2020-07-08 ENCOUNTER — Other Ambulatory Visit: Payer: Self-pay

## 2020-07-08 DIAGNOSIS — Z1231 Encounter for screening mammogram for malignant neoplasm of breast: Secondary | ICD-10-CM

## 2020-09-03 ENCOUNTER — Encounter: Payer: Self-pay | Admitting: Obstetrics and Gynecology

## 2020-09-03 ENCOUNTER — Ambulatory Visit (INDEPENDENT_AMBULATORY_CARE_PROVIDER_SITE_OTHER): Payer: BC Managed Care – PPO | Admitting: Obstetrics and Gynecology

## 2020-09-03 ENCOUNTER — Other Ambulatory Visit (HOSPITAL_COMMUNITY)
Admission: RE | Admit: 2020-09-03 | Discharge: 2020-09-03 | Disposition: A | Discharge status: Home / Self Care | Payer: BC Managed Care – PPO | Source: Ambulatory Visit | Attending: Obstetrics and Gynecology | Admitting: Obstetrics and Gynecology

## 2020-09-03 ENCOUNTER — Other Ambulatory Visit: Payer: Self-pay

## 2020-09-03 VITALS — BP 131/81 | HR 88 | Ht 68.0 in | Wt 218.0 lb

## 2020-09-03 DIAGNOSIS — N3020 Other chronic cystitis without hematuria: Secondary | ICD-10-CM | POA: Diagnosis not present

## 2020-09-03 DIAGNOSIS — R102 Pelvic and perineal pain: Secondary | ICD-10-CM

## 2020-09-03 DIAGNOSIS — Z1389 Encounter for screening for other disorder: Secondary | ICD-10-CM | POA: Diagnosis not present

## 2020-09-03 DIAGNOSIS — N898 Other specified noninflammatory disorders of vagina: Secondary | ICD-10-CM | POA: Diagnosis not present

## 2020-09-03 LAB — POCT URINALYSIS DIPSTICK OB
Blood, UA: NEGATIVE
Glucose, UA: NEGATIVE
Ketones, UA: NEGATIVE
Leukocytes, UA: NEGATIVE
Nitrite, UA: NEGATIVE
POC,PROTEIN,UA: NEGATIVE

## 2020-09-03 MED ORDER — NITROFURANTOIN MONOHYD MACRO 100 MG PO CAPS: ORAL_CAPSULE | ORAL | 0 refills | Status: DC

## 2020-09-03 MED ORDER — URIBEL 118 MG PO CAPS: 1.0000 | ORAL_CAPSULE | Freq: Three times a day (TID) | ORAL | 0 refills | Status: DC

## 2020-09-03 NOTE — Progress Notes (Signed)
Dana Lynch presents with c/o lower abd pain, cramps and pressure with increased urinary frequency. Some icont at times. A little discomfort with intercourse. Sx for the last month She is also being treated for sciatic pain  She denies any fever, chills, or N/V  H/O DM, reports controlled H/O Supracervical hysy in 2020  PE AF VSS Lungs clear Heart  RRR Abd soft + BS GU Nl EGBUS, + bladder tenderness, scant white vaginal discharge  A/P Chronic cystitis        Vaginal discharge  Vaginal swab collected. Macrobid and Uribel for cystitis. Pt to call back if Sx have not resolved or improved in 10-14 days.

## 2020-09-05 LAB — URINE CULTURE

## 2020-09-07 LAB — CERVICOVAGINAL ANCILLARY ONLY
Bacterial Vaginitis (gardnerella): NEGATIVE
Candida Glabrata: NEGATIVE
Candida Vaginitis: NEGATIVE
Chlamydia: NEGATIVE
Comment: NEGATIVE
Comment: NEGATIVE
Comment: NEGATIVE
Comment: NEGATIVE
Comment: NEGATIVE
Comment: NORMAL
Neisseria Gonorrhea: NEGATIVE
Trichomonas: NEGATIVE

## 2021-07-06 ENCOUNTER — Other Ambulatory Visit: Payer: Self-pay | Admitting: Internal Medicine

## 2021-07-06 DIAGNOSIS — Z1231 Encounter for screening mammogram for malignant neoplasm of breast: Secondary | ICD-10-CM

## 2021-07-26 DIAGNOSIS — Z0001 Encounter for general adult medical examination with abnormal findings: Secondary | ICD-10-CM | POA: Diagnosis not present

## 2021-07-26 DIAGNOSIS — E1165 Type 2 diabetes mellitus with hyperglycemia: Secondary | ICD-10-CM | POA: Diagnosis not present

## 2021-07-26 DIAGNOSIS — H6121 Impacted cerumen, right ear: Secondary | ICD-10-CM | POA: Diagnosis not present

## 2021-07-26 DIAGNOSIS — E782 Mixed hyperlipidemia: Secondary | ICD-10-CM | POA: Diagnosis not present

## 2021-07-26 DIAGNOSIS — Z124 Encounter for screening for malignant neoplasm of cervix: Secondary | ICD-10-CM | POA: Diagnosis not present

## 2021-07-26 DIAGNOSIS — I10 Essential (primary) hypertension: Secondary | ICD-10-CM | POA: Diagnosis not present

## 2021-07-26 DIAGNOSIS — Z23 Encounter for immunization: Secondary | ICD-10-CM | POA: Diagnosis not present

## 2021-07-26 DIAGNOSIS — Z1231 Encounter for screening mammogram for malignant neoplasm of breast: Secondary | ICD-10-CM | POA: Diagnosis not present

## 2021-07-28 ENCOUNTER — Ambulatory Visit
Admission: RE | Admit: 2021-07-28 | Discharge: 2021-07-28 | Disposition: A | Discharge status: Home / Self Care | Payer: BC Managed Care – PPO | Source: Ambulatory Visit | Attending: Internal Medicine | Admitting: Internal Medicine

## 2021-07-28 ENCOUNTER — Other Ambulatory Visit: Payer: Self-pay

## 2021-07-28 DIAGNOSIS — Z1231 Encounter for screening mammogram for malignant neoplasm of breast: Secondary | ICD-10-CM

## 2021-08-29 DIAGNOSIS — E669 Obesity, unspecified: Secondary | ICD-10-CM | POA: Diagnosis not present

## 2021-08-29 DIAGNOSIS — J029 Acute pharyngitis, unspecified: Secondary | ICD-10-CM | POA: Diagnosis not present

## 2021-08-29 DIAGNOSIS — I739 Peripheral vascular disease, unspecified: Secondary | ICD-10-CM | POA: Diagnosis not present

## 2021-08-29 DIAGNOSIS — E1165 Type 2 diabetes mellitus with hyperglycemia: Secondary | ICD-10-CM | POA: Diagnosis not present

## 2021-08-29 DIAGNOSIS — I10 Essential (primary) hypertension: Secondary | ICD-10-CM | POA: Diagnosis not present

## 2021-11-03 ENCOUNTER — Telehealth: Payer: BC Managed Care – PPO | Admitting: Adult Health

## 2021-12-10 ENCOUNTER — Encounter (HOSPITAL_COMMUNITY): Payer: Self-pay | Admitting: Emergency Medicine

## 2021-12-10 ENCOUNTER — Emergency Department (HOSPITAL_COMMUNITY): Payer: BC Managed Care – PPO

## 2021-12-10 ENCOUNTER — Inpatient Hospital Stay (HOSPITAL_COMMUNITY)
Admission: EM | Admit: 2021-12-10 | Discharge: 2021-12-12 | DRG: 661 | Disposition: A | Discharge status: Home / Self Care | Payer: BC Managed Care – PPO | Attending: Internal Medicine | Admitting: Internal Medicine

## 2021-12-10 ENCOUNTER — Other Ambulatory Visit: Payer: Self-pay

## 2021-12-10 DIAGNOSIS — R319 Hematuria, unspecified: Secondary | ICD-10-CM

## 2021-12-10 DIAGNOSIS — N3001 Acute cystitis with hematuria: Secondary | ICD-10-CM

## 2021-12-10 DIAGNOSIS — N136 Pyonephrosis: Secondary | ICD-10-CM | POA: Diagnosis not present

## 2021-12-10 DIAGNOSIS — Z833 Family history of diabetes mellitus: Secondary | ICD-10-CM | POA: Diagnosis not present

## 2021-12-10 DIAGNOSIS — Z90710 Acquired absence of both cervix and uterus: Secondary | ICD-10-CM | POA: Diagnosis not present

## 2021-12-10 DIAGNOSIS — E876 Hypokalemia: Secondary | ICD-10-CM | POA: Diagnosis present

## 2021-12-10 DIAGNOSIS — N390 Urinary tract infection, site not specified: Secondary | ICD-10-CM

## 2021-12-10 DIAGNOSIS — Z79899 Other long term (current) drug therapy: Secondary | ICD-10-CM | POA: Diagnosis not present

## 2021-12-10 DIAGNOSIS — R112 Nausea with vomiting, unspecified: Secondary | ICD-10-CM | POA: Diagnosis not present

## 2021-12-10 DIAGNOSIS — E1165 Type 2 diabetes mellitus with hyperglycemia: Secondary | ICD-10-CM | POA: Diagnosis not present

## 2021-12-10 DIAGNOSIS — Z7984 Long term (current) use of oral hypoglycemic drugs: Secondary | ICD-10-CM | POA: Diagnosis not present

## 2021-12-10 DIAGNOSIS — I10 Essential (primary) hypertension: Secondary | ICD-10-CM | POA: Diagnosis not present

## 2021-12-10 DIAGNOSIS — Z803 Family history of malignant neoplasm of breast: Secondary | ICD-10-CM

## 2021-12-10 DIAGNOSIS — Z9049 Acquired absence of other specified parts of digestive tract: Secondary | ICD-10-CM

## 2021-12-10 DIAGNOSIS — N200 Calculus of kidney: Secondary | ICD-10-CM

## 2021-12-10 DIAGNOSIS — D72828 Other elevated white blood cell count: Secondary | ICD-10-CM | POA: Diagnosis present

## 2021-12-10 DIAGNOSIS — Z8249 Family history of ischemic heart disease and other diseases of the circulatory system: Secondary | ICD-10-CM | POA: Diagnosis not present

## 2021-12-10 DIAGNOSIS — E669 Obesity, unspecified: Secondary | ICD-10-CM | POA: Diagnosis present

## 2021-12-10 DIAGNOSIS — E782 Mixed hyperlipidemia: Secondary | ICD-10-CM | POA: Diagnosis not present

## 2021-12-10 DIAGNOSIS — D72829 Elevated white blood cell count, unspecified: Secondary | ICD-10-CM

## 2021-12-10 DIAGNOSIS — Z6833 Body mass index (BMI) 33.0-33.9, adult: Secondary | ICD-10-CM

## 2021-12-10 DIAGNOSIS — N132 Hydronephrosis with renal and ureteral calculous obstruction: Secondary | ICD-10-CM | POA: Diagnosis not present

## 2021-12-10 DIAGNOSIS — N201 Calculus of ureter: Secondary | ICD-10-CM | POA: Diagnosis not present

## 2021-12-10 LAB — URINALYSIS, ROUTINE W REFLEX MICROSCOPIC
Bacteria, UA: NONE SEEN
Bilirubin Urine: NEGATIVE
Glucose, UA: NEGATIVE mg/dL
Ketones, ur: 20 mg/dL — AB
Nitrite: NEGATIVE
Protein, ur: 100 mg/dL — AB
RBC / HPF: >50 RBC/hpf — ABNORMAL HIGH (ref 0–5)
Specific Gravity, Urine: 1.026 (ref 1.005–1.030)
pH: 7 (ref 5.0–8.0)

## 2021-12-10 LAB — BASIC METABOLIC PANEL
Anion gap: 11 (ref 5–15)
BUN: 12 mg/dL (ref 6–20)
CO2: 21 mmol/L — ABNORMAL LOW (ref 22–32)
Calcium: 9.5 mg/dL (ref 8.9–10.3)
Chloride: 105 mmol/L (ref 98–111)
Creatinine, Ser: 0.75 mg/dL (ref 0.44–1.00)
GFR, Estimated: >60 mL/min (ref >60)
Glucose, Bld: 218 mg/dL — ABNORMAL HIGH (ref 70–99)
Potassium: 3.4 mmol/L — ABNORMAL LOW (ref 3.5–5.1)
Sodium: 137 mmol/L (ref 135–145)

## 2021-12-10 LAB — CBC
HCT: 45 % (ref 36.0–46.0)
Hemoglobin: 15.6 g/dL — ABNORMAL HIGH (ref 12.0–15.0)
MCH: 29.8 pg (ref 26.0–34.0)
MCHC: 34.7 g/dL (ref 30.0–36.0)
MCV: 85.9 fL (ref 80.0–100.0)
Platelets: 308 10*3/uL (ref 150–400)
RBC: 5.24 MIL/uL — ABNORMAL HIGH (ref 3.87–5.11)
RDW: 11.8 % (ref 11.5–15.5)
WBC: 16 10*3/uL — ABNORMAL HIGH (ref 4.0–10.5)
nRBC: 0 % (ref 0.0–0.2)

## 2021-12-10 MED ORDER — SODIUM CHLORIDE 0.9 % IV SOLN: INTRAVENOUS | Status: AC

## 2021-12-10 MED ORDER — SODIUM CHLORIDE 0.9 % IV SOLN
2.0000 g | Freq: Once | INTRAVENOUS | Status: AC
  Administered 2021-12-10: 2 g via INTRAVENOUS
  Filled 2021-12-10: qty 20

## 2021-12-10 MED ORDER — POTASSIUM CHLORIDE 20 MEQ PO PACK
20.0000 meq | PACK | Freq: Once | ORAL | Status: AC
  Administered 2021-12-10: 20 meq via ORAL
  Filled 2021-12-10: qty 1

## 2021-12-10 MED ORDER — INSULIN ASPART 100 UNIT/ML IJ SOLN
0.0000 [IU] | INTRAMUSCULAR | Status: DC
  Administered 2021-12-11: 2 [IU] via SUBCUTANEOUS
  Administered 2021-12-11: 3 [IU] via SUBCUTANEOUS
  Administered 2021-12-11: 2 [IU] via SUBCUTANEOUS
  Filled 2021-12-10: qty 1

## 2021-12-10 MED ORDER — MORPHINE SULFATE (PF) 2 MG/ML IV SOLN
2.0000 mg | Freq: Once | INTRAVENOUS | Status: AC
  Administered 2021-12-10: 2 mg via INTRAVENOUS
  Filled 2021-12-10: qty 1

## 2021-12-10 MED ORDER — IOHEXOL 300 MG/ML  SOLN
100.0000 mL | Freq: Once | INTRAMUSCULAR | Status: AC | PRN
  Administered 2021-12-10: 100 mL via INTRAVENOUS

## 2021-12-10 MED ORDER — MORPHINE SULFATE (PF) 2 MG/ML IV SOLN
2.0000 mg | INTRAVENOUS | Status: DC | PRN
  Administered 2021-12-11 – 2021-12-12 (×3): 2 mg via INTRAVENOUS
  Filled 2021-12-10 (×3): qty 1

## 2021-12-10 MED ORDER — CEFTRIAXONE SODIUM 1 G IJ SOLR
1.0000 g | INTRAMUSCULAR | Status: DC
  Administered 2021-12-11: 1 g via INTRAVENOUS

## 2021-12-10 MED ORDER — ONDANSETRON HCL 4 MG/2ML IJ SOLN
4.0000 mg | Freq: Four times a day (QID) | INTRAMUSCULAR | Status: DC | PRN
  Administered 2021-12-11: 4 mg via INTRAVENOUS
  Filled 2021-12-10: qty 2

## 2021-12-10 MED ORDER — ONDANSETRON HCL 4 MG/2ML IJ SOLN
4.0000 mg | Freq: Once | INTRAMUSCULAR | Status: AC
  Administered 2021-12-10: 4 mg via INTRAVENOUS
  Filled 2021-12-10: qty 2

## 2021-12-10 MED ORDER — LACTATED RINGERS IV BOLUS
1000.0000 mL | Freq: Once | INTRAVENOUS | Status: AC
  Administered 2021-12-10: 1000 mL via INTRAVENOUS

## 2021-12-10 NOTE — ED Triage Notes (Signed)
Pt with c/o L sided flank pain that radiates around to L groin and bladder area. States she was dx with a kidney stone 2-3 weeks ago. Pt also c/o nausea and a fever (did not check with thermometer but had chills) at home. Pt took Ibuprofen @ 4pm and Tylenol @ 6pm.Pt states she is having urinary frequency.

## 2021-12-10 NOTE — ED Notes (Signed)
Patient transported to CT 

## 2021-12-10 NOTE — H&P (View-Only) (Signed)
Urology Consult   Physician requesting consult: Dr. Sabra Heck  Reason for consult: Left-sided kidney stone with infection  History of Present Illness: Dana Lynch is a 51 y.o. female presenting to the emergency room at [facility] with several week history of urinary tract infection but relatively new onset left flank pain, chills, nausea and vomiting.  Patient was found to have infected urine and CT showing a large left proximal ureteral stone.  Urologic consultation is requested.  She does have a history of diabetes and hypertension.  She had urinary tract infection treated several weeks ago.   Past Medical History:  Diagnosis Date   Bowel habit changes 12/22/2013   Alternates constipation and diarrhea. Will refer to GI    Diabetes ([place]) 02/16/2014   Fibroids 12/22/2013   Hypertension    PONV (postoperative nausea and vomiting)    Unspecified symptom associated with female genital organs 12/22/2013    Past Surgical History:  Procedure Laterality Date   ABDOMINAL HYSTERECTOMY N/A 05/20/2019   Procedure: SUPRACERVICAL ABDOMINAL HYSTERECTOMY WITH LEFT SALPINGECTOMY;  Surgeon: Jonnie Kind, MD;  Location: [facility];  Service: Gynecology;  Laterality: N/A;   APPENDECTOMY     BREAST BIOPSY Left    ENDOMETRIAL ABLATION     TONSILLECTOMY AND ADENOIDECTOMY       Current Hospital Medications: Scheduled Meds:   morphine injection  2 mg Intravenous Once   potassium chloride  20 mEq Oral Once   Continuous Infusions:  cefTRIAXone (ROCEPHIN)  IV     PRN Meds:.  Allergies: No Known Allergies  Family History  Problem Relation Age of Onset   Diabetes Mother    Diabetes Maternal Grandmother    Hypertension Maternal Grandmother    Cancer Maternal Grandfather        prostate   Heart disease Paternal Grandmother    Diabetes Paternal Grandmother    Heart disease Paternal Grandfather    Breast cancer Maternal Aunt     Social History:  reports that she has never smoked.  She has never used smokeless tobacco. She reports current alcohol use. She reports that she does not use drugs.  ROS: A complete review of systems was performed.  All systems are negative except for pertinent findings as noted.  Physical Exam:  Vital signs in last 24 hours: Temp:  [98.1 F (36.7 C)] 98.1 F (36.7 C) (06/03 2010) Pulse Rate:  [76-90] 76 (06/03 2130) Resp:  [13-20] 13 (06/03 2130) BP: (145-150)/(84-86) 145/84 (06/03 2130) SpO2:  [97 %-100 %] 97 % (06/03 2130) Weight:  [99 kg] 99 kg (06/03 2011) General:  Alert and oriented, No acute distress HEENT: Normocephalic, atraumatic Neck: No JVD or lymphadenopathy Cardiovascular: Regular rate  Lungs: Normal inspiratory/expiratory excursion Extremities: No edema Neurologic: Grossly intact  Laboratory Data:  Recent Labs    12/10/21 2015  WBC 16.0*  HGB 15.6*  HCT 45.0  PLT 308    Recent Labs    12/10/21 2015  NA 137  K 3.4*  CL 105  GLUCOSE 218*  BUN 12  CALCIUM 9.5  CREATININE 0.75     Results for orders placed or performed during the hospital encounter of 12/10/21 (from the past 24 hour(s))  Urinalysis, Routine w reflex microscopic Urine, Clean Catch     Status: Abnormal   Collection Time: 12/10/21  8:12 PM  Result Value Ref Range   Color, Urine AMBER (A) YELLOW   APPearance CLOUDY (A) CLEAR   Specific Gravity, Urine 1.026 1.005 - 1.030  pH 7.0 5.0 - 8.0   Glucose, UA NEGATIVE NEGATIVE mg/dL   Hgb urine dipstick LARGE (A) NEGATIVE   Bilirubin Urine NEGATIVE NEGATIVE   Ketones, ur 20 (A) NEGATIVE mg/dL   Protein, ur 100 (A) NEGATIVE mg/dL   Nitrite NEGATIVE NEGATIVE   Leukocytes,Ua TRACE (A) NEGATIVE   RBC / HPF >50 (H) 0 - 5 RBC/hpf   WBC, UA 21-50 0 - 5 WBC/hpf   Bacteria, UA NONE SEEN NONE SEEN   Squamous Epithelial / LPF 0-5 0 - 5   Mucus PRESENT   Basic metabolic panel     Status: Abnormal   Collection Time: 12/10/21  8:15 PM  Result Value Ref Range   Sodium 137 135 - 145 mmol/L    Potassium 3.4 (L) 3.5 - 5.1 mmol/L   Chloride 105 98 - 111 mmol/L   CO2 21 (L) 22 - 32 mmol/L   Glucose, Bld 218 (H) 70 - 99 mg/dL   BUN 12 6 - 20 mg/dL   Creatinine, Ser 0.75 0.44 - 1.00 mg/dL   Calcium 9.5 8.9 - 10.3 mg/dL   GFR, Estimated >60 >60 mL/min   Anion gap 11 5 - 15  CBC     Status: Abnormal   Collection Time: 12/10/21  8:15 PM  Result Value Ref Range   WBC 16.0 (H) 4.0 - 10.5 K/uL   RBC 5.24 (H) 3.87 - 5.11 MIL/uL   Hemoglobin 15.6 (H) 12.0 - 15.0 g/dL   HCT 45.0 36.0 - 46.0 %   MCV 85.9 80.0 - 100.0 fL   MCH 29.8 26.0 - 34.0 pg   MCHC 34.7 30.0 - 36.0 g/dL   RDW 11.8 11.5 - 15.5 %   Platelets 308 150 - 400 K/uL   nRBC 0.0 0.0 - 0.2 %   No results found for this or any previous visit (from the past 240 hour(s)).  Renal Function: Recent Labs    12/10/21 2015  CREATININE 0.75   Estimated Creatinine Clearance: 100 mL/min (by C-G formula based on SCr of 0.75 mg/dL).  Radiologic Imaging: CT ABDOMEN PELVIS W CONTRAST  Result Date: 12/10/2021 CLINICAL DATA:  Abdominal pain, acute, nonlocalized. States she was dx with a kidney stone 2-3 weeks ago. Pt also c/o nausea and a fever EXAM: CT ABDOMEN AND PELVIS WITH CONTRAST TECHNIQUE: Multidetector CT imaging of the abdomen and pelvis was performed using the standard protocol following bolus administration of intravenous contrast. RADIATION DOSE REDUCTION: This exam was performed according to the departmental dose-optimization program which includes automated exposure control, adjustment of the mA and/or kV according to patient size and/or use of iterative reconstruction technique. CONTRAST:  118m OMNIPAQUE IOHEXOL 300 MG/ML  SOLN COMPARISON:  None Available. FINDINGS: Lower chest: Left lower lobe linear atelectasis versus scarring. Hepatobiliary: No focal liver abnormality. No gallstones, gallbladder wall thickening, or pericholecystic fluid. No biliary dilatation. Pancreas: No focal lesion. Normal pancreatic contour. No  surrounding inflammatory changes. No main pancreatic ductal dilatation. Spleen: Normal in size without focal abnormality. Splenule is noted (2:26). Adrenals/Urinary Tract: No adrenal nodule bilaterally. Left perinephric fat stranding. Delayed left nephrogram. Urothelial thickening of the left collecting system at the level of the ureteropelvic junction. There is an 8 mm calcified stone at the left ureteropelvic junction with associated proximal mild to moderate hydronephrosis. The left ureter distally is normal in caliber. No right nephroureterolithiasis.  No right hydroureteronephrosis. Subcentimeter hypodensities too small to characterize. The urinary bladder is unremarkable. Stomach/Bowel: Stomach is within normal limits. No evidence of  bowel wall thickening or dilatation. Status post appendectomy. Unremarkable appendiceal stump. Vascular/Lymphatic: No abdominal aorta or iliac aneurysm. No abdominal, pelvic, or inguinal lymphadenopathy. Reproductive: Status post hysterectomy. No adnexal masses. Other: No intraperitoneal free fluid. No intraperitoneal free gas. No organized fluid collection. Musculoskeletal: No abdominal wall hernia or abnormality. No suspicious lytic or blastic osseous lesions. No acute displaced fracture. IMPRESSION: An 8 mm obstructive left ureterovesicular junction stone with findings suggestive of superimposed infection. Correlate with urinalysis. Electronically Signed   By: Iven Finn M.D.   On: 12/10/2021 22:42    I independently reviewed the above imaging studies.  Impression/Assessment:  Obstructing left upper ureteral stone with urinary tract infection  Plan:  Cystoscopy, left retrograde ureteropyelogram, double-J stent placement.  I have discussed the procedure with the patient, the fact that we will not remove the stone at this point, just provide drainage for the left renal unit with eventual treatment once infection has been adequately treated.  She understands and  desires to proceed.

## 2021-12-10 NOTE — ED Provider Notes (Signed)
[facility] EMERGENCY DEPARTMENT Provider Note   CSN: [id number] Arrival date & time: 12/10/21  1953     History  Chief Complaint  Patient presents with   Flank Pain    Dana Lynch is a 51 y.o. female.  HPI Patient is a 51 year old female with a history of hypertension, diabetes mellitus, hyperlipidemia, who presents to the emergency department due to abdominal pain.  Patient states she was initially treated for UTI about 4 to 5 weeks ago.  She completed a course of antibiotics and her symptoms improved for about 2 weeks.  She states that she began experiencing similar symptoms as well as pain about 2 weeks ago and was diagnosed with a kidney stone which she was then treated for.  Earlier today she began developing left-sided abdominal and flank pain.  Symptoms have been persistent.  Reports associated nausea, vomiting, chills.  Also notes diarrhea for the past 3 days.  No hematochezia.    Home Medications Prior to Admission medications   Medication Sig Start Date End Date Taking? Authorizing Provider  blood glucose meter kit and supplies KIT Dispense based on patient and insurance preference. Use up to four times daily as directed. (FOR ICD-10 E11.65) 03/13/19   Cassandria Anger, MD  Blood Glucose Monitoring Suppl (ONETOUCH VERIO) w/Device KIT 1 each by Does not apply route as needed. 03/12/19   Cassandria Anger, MD  Cholecalciferol 125 MCG (5000 UT) TABS Take by mouth.    [provider]  glipiZIDE (GLUCOTROL XL) 5 MG 24 hr tablet Take 2 tablets (10 mg total) by mouth daily with breakfast. 05/12/19   Nida, Marella Chimes, MD  glucose blood (ONETOUCH VERIO) test strip Use as instructed 03/12/19   Cassandria Anger, MD  hydrochlorothiazide (HYDRODIURIL) 25 MG tablet Take 1 tablet (25 mg total) by mouth daily. For postop fluid retention. 05/27/19   Jonnie Kind, MD  lisinopril (ZESTRIL) 5 MG tablet TAKE 1 TABLET(5 MG) BY MOUTH DAILY 07/08/19   Derrek Monaco A, NP  metFORMIN (GLUCOPHAGE-XR) 500 MG 24 hr tablet TAKE 1 TABLET(500 MG) BY MOUTH TWICE DAILY AFTER A MEAL 10/08/19   Nida, Marella Chimes, MD  Meth-Hyo-M Barnett Hatter Phos-Ph Sal (URIBEL) 118 MG CAPS Take 1 capsule (118 mg total) by mouth in the morning, at noon, and at bedtime. 09/03/20   Chancy Milroy, MD  nitrofurantoin, macrocrystal-monohydrate, (MACROBID) 100 MG capsule 1 tablet bid x 7 days and then 1 tablet qhs until gone 09/03/20   Chancy Milroy, MD  simvastatin (ZOCOR) 20 MG tablet Take 1 tablet (20 mg total) by mouth daily at 6 PM. 05/12/19   Nida, Marella Chimes, MD      Allergies    Patient has no known allergies.    Review of Systems   Review of Systems  All other systems reviewed and are negative. Ten systems reviewed and are negative for acute change, except as noted in the HPI.   Physical Exam Updated Vital Signs BP (!) 145/84   Pulse 76   Temp 98.1 F (36.7 C) (Oral)   Resp 13   Ht 5' 8"  (1.727 m)   Wt 99 kg   LMP  (LMP Unknown)   SpO2 97%   BMI 33.19 kg/m  Physical Exam Vitals and nursing note reviewed.  Constitutional:      General: She is not in acute distress.    Appearance: Normal appearance. She is not ill-appearing, toxic-appearing or diaphoretic.  HENT:  Head: Normocephalic and atraumatic.     Right Ear: External ear normal.     Left Ear: External ear normal.     Nose: Nose normal.     Mouth/Throat:     Mouth: Mucous membranes are moist.     Pharynx: Oropharynx is clear. No oropharyngeal exudate or posterior oropharyngeal erythema.  Eyes:     Extraocular Movements: Extraocular movements intact.  Cardiovascular:     Rate and Rhythm: Normal rate and regular rhythm.     Pulses: Normal pulses.     Heart sounds: Normal heart sounds. No murmur heard.   No friction rub. No gallop.  Pulmonary:     Effort: Pulmonary effort is normal. No respiratory distress.     Breath sounds: Normal breath sounds. No stridor. No wheezing, rhonchi or rales.   Abdominal:     General: Abdomen is flat.     Palpations: Abdomen is soft.     Tenderness: There is abdominal tenderness.     Comments: Protuberant abdomen that is soft.  Moderate tenderness noted along the left abdomen diffusely.  No CVA tenderness.  Musculoskeletal:        General: Normal range of motion.     Cervical back: Normal range of motion and neck supple. No tenderness.  Skin:    General: Skin is warm and dry.  Neurological:     General: No focal deficit present.     Mental Status: She is alert and oriented to person, place, and time.  Psychiatric:        Mood and Affect: Mood normal.        Behavior: Behavior normal.   ED Results / Procedures / Treatments   Labs (all labs ordered are listed, but only abnormal results are displayed) Labs Reviewed  URINALYSIS, ROUTINE W REFLEX MICROSCOPIC - Abnormal; Notable for the following components:      Result Value   Color, Urine AMBER (*)    APPearance CLOUDY (*)    Hgb urine dipstick LARGE (*)    Ketones, ur 20 (*)    Protein, ur 100 (*)    Leukocytes,Ua TRACE (*)    RBC / HPF >50 (*)    All other components within normal limits  BASIC METABOLIC PANEL - Abnormal; Notable for the following components:   Potassium 3.4 (*)    CO2 21 (*)    Glucose, Bld 218 (*)    All other components within normal limits  CBC - Abnormal; Notable for the following components:   WBC 16.0 (*)    RBC 5.24 (*)    Hemoglobin 15.6 (*)    All other components within normal limits  URINE CULTURE   EKG EKG Interpretation  Date/Time:  Saturday December 10 2021 21:24:00 EDT Ventricular Rate:  89 PR Interval:  164 QRS Duration: 92 QT Interval:  390 QTC Calculation: 475 R Axis:   41 Text Interpretation: Sinus rhythm Confirmed by Noemi Chapel [phone]) on 12/10/2021 9:26:53 PM  Radiology CT ABDOMEN PELVIS W CONTRAST  Result Date: 12/10/2021 CLINICAL DATA:  Abdominal pain, acute, nonlocalized. States she was dx with a kidney stone 2-3 weeks ago. Pt  also c/o nausea and a fever EXAM: CT ABDOMEN AND PELVIS WITH CONTRAST TECHNIQUE: Multidetector CT imaging of the abdomen and pelvis was performed using the standard protocol following bolus administration of intravenous contrast. RADIATION DOSE REDUCTION: This exam was performed according to the departmental dose-optimization program which includes automated exposure control, adjustment of the mA and/or kV according to patient  size and/or use of iterative reconstruction technique. CONTRAST:  177m OMNIPAQUE IOHEXOL 300 MG/ML  SOLN COMPARISON:  None Available. FINDINGS: Lower chest: Left lower lobe linear atelectasis versus scarring. Hepatobiliary: No focal liver abnormality. No gallstones, gallbladder wall thickening, or pericholecystic fluid. No biliary dilatation. Pancreas: No focal lesion. Normal pancreatic contour. No surrounding inflammatory changes. No main pancreatic ductal dilatation. Spleen: Normal in size without focal abnormality. Splenule is noted (2:26). Adrenals/Urinary Tract: No adrenal nodule bilaterally. Left perinephric fat stranding. Delayed left nephrogram. Urothelial thickening of the left collecting system at the level of the ureteropelvic junction. There is an 8 mm calcified stone at the left ureteropelvic junction with associated proximal mild to moderate hydronephrosis. The left ureter distally is normal in caliber. No right nephroureterolithiasis.  No right hydroureteronephrosis. Subcentimeter hypodensities too small to characterize. The urinary bladder is unremarkable. Stomach/Bowel: Stomach is within normal limits. No evidence of bowel wall thickening or dilatation. Status post appendectomy. Unremarkable appendiceal stump. Vascular/Lymphatic: No abdominal aorta or iliac aneurysm. No abdominal, pelvic, or inguinal lymphadenopathy. Reproductive: Status post hysterectomy. No adnexal masses. Other: No intraperitoneal free fluid. No intraperitoneal free gas. No organized fluid collection.  Musculoskeletal: No abdominal wall hernia or abnormality. No suspicious lytic or blastic osseous lesions. No acute displaced fracture. IMPRESSION: An 8 mm obstructive left ureterovesicular junction stone with findings suggestive of superimposed infection. Correlate with urinalysis. Electronically Signed   By: MIven FinnM.D.   On: 12/10/2021 22:42    Procedures Procedures   Medications Ordered in ED Medications  potassium chloride (KLOR-CON) packet 20 mEq (has no administration in time range)  cefTRIAXone (ROCEPHIN) 2 g in sodium chloride 0.9 % 100 mL IVPB (has no administration in time range)  morphine (PF) 2 MG/ML injection 2 mg (has no administration in time range)  lactated ringers bolus 1,000 mL (1,000 mLs Intravenous New Bag/Given 12/10/21 2121)  morphine (PF) 2 MG/ML injection 2 mg (2 mg Intravenous Given 12/10/21 2121)  ondansetron (ZOFRAN) injection 4 mg (4 mg Intravenous Given 12/10/21 2121)  iohexol (OMNIPAQUE) 300 MG/ML solution 100 mL (100 mLs Intravenous Contrast Given 12/10/21 2159)  morphine (PF) 2 MG/ML injection 2 mg (2 mg Intravenous Given 12/10/21 2145)    ED Course/ Medical Decision Making/ A&P Clinical Course as of 12/10/21 2302  Sat Dec 10, 2021  2140 Ketones, ur(!): 20 [LJ]  2140 Protein(!): 100 [LJ]  2140 RBC / HPF(!): >50 [LJ]  2140 Leukocytes,Ua(!): TRACE [LJ]  2140 WBC, UA: 21-50 [LJ]    Clinical Course User Index [LJ] JRayna Sexton PA-C                           Medical Decision Making Amount and/or Complexity of Data Reviewed Labs: ordered. Decision-making details documented in ED Course. Radiology: ordered.  Risk Prescription drug management. Decision regarding hospitalization.   Pt is a 51y.o. female who presents to the emergency department due to abdominal pain, nausea, vomiting, dysuria, diarrhea.  Labs: CBC with a white count of 16 and a hemoglobin of 15.6. BMP with a potassium of 3.4, CO2 21, glucose of 218. UA with large hemoglobin, 20  ketones, 100 protein, trace leukocytes, greater than 50 red blood cells, 21-50 white blood cells.  Imaging: CT scan of the abdomen/pelvis with contrast shows an 8 mm obstructive left UVJ stone with findings suggestive of superimposed infection.  Correlate with urinalysis.  I, LRayna Sexton PA-C, personally reviewed and evaluated these images and lab results as part of  my medical decision-making.  CT scan and UA concerning for 8 mm obstructive left-sided stone with superimposed infection.  Leukocytosis of 16.  Patient currently afebrile and nontachycardic.  No episodes of hypotension.  Does not appear consistent with septic shock.  Patient started on IV fluids, morphine, as well as Zofran for her nausea.  Reports mild improvement of her pain and complete resolution of her nausea.  Patient given a dose of IV Rocephin.  Patient discussed with Dr. Diona Fanti.  Recommends medicine admission.  He is going to place a stent tonight.  This was discussed with the patient and she is agreeable with this plan.  We will discussed with the medicine team at this time.  Note: Portions of this report may have been transcribed using voice recognition software. Every effort was made to ensure accuracy; however, inadvertent computerized transcription errors may be present.   Final Clinical Impression(s) / ED Diagnoses Final diagnoses:  Kidney stone  Acute cystitis with hematuria   Rx / DC Orders ED Discharge Orders     None         Rayna Sexton, PA-C 12/10/21 2306    Noemi Chapel, MD 12/12/21 1229

## 2021-12-10 NOTE — ED Notes (Signed)
Pt returned from CT Scan 

## 2021-12-10 NOTE — Consult Note (Addendum)
Urology Consult   Physician requesting consult: Dr. Sabra Heck  Reason for consult: Left-sided kidney stone with infection  History of Present Illness: Dana Lynch is a 51 y.o. female presenting to the emergency room at [facility] with several week history of urinary tract infection but relatively new onset left flank pain, chills, nausea and vomiting.  Patient was found to have infected urine and CT showing a large left proximal ureteral stone.  Urologic consultation is requested.  She does have a history of diabetes and hypertension.  She had urinary tract infection treated several weeks ago.   Past Medical History:  Diagnosis Date   Bowel habit changes 12/22/2013   Alternates constipation and diarrhea. Will refer to GI    Diabetes ([place]) 02/16/2014   Fibroids 12/22/2013   Hypertension    PONV (postoperative nausea and vomiting)    Unspecified symptom associated with female genital organs 12/22/2013    Past Surgical History:  Procedure Laterality Date   ABDOMINAL HYSTERECTOMY N/A 05/20/2019   Procedure: SUPRACERVICAL ABDOMINAL HYSTERECTOMY WITH LEFT SALPINGECTOMY;  Surgeon: Jonnie Kind, MD;  Location: [facility];  Service: Gynecology;  Laterality: N/A;   APPENDECTOMY     BREAST BIOPSY Left    ENDOMETRIAL ABLATION     TONSILLECTOMY AND ADENOIDECTOMY       Current Hospital Medications: Scheduled Meds:   morphine injection  2 mg Intravenous Once   potassium chloride  20 mEq Oral Once   Continuous Infusions:  cefTRIAXone (ROCEPHIN)  IV     PRN Meds:.  Allergies: No Known Allergies  Family History  Problem Relation Age of Onset   Diabetes Mother    Diabetes Maternal Grandmother    Hypertension Maternal Grandmother    Cancer Maternal Grandfather        prostate   Heart disease Paternal Grandmother    Diabetes Paternal Grandmother    Heart disease Paternal Grandfather    Breast cancer Maternal Aunt     Social History:  reports that she has never smoked.  She has never used smokeless tobacco. She reports current alcohol use. She reports that she does not use drugs.  ROS: A complete review of systems was performed.  All systems are negative except for pertinent findings as noted.  Physical Exam:  Vital signs in last 24 hours: Temp:  [98.1 F (36.7 C)] 98.1 F (36.7 C) (06/03 2010) Pulse Rate:  [76-90] 76 (06/03 2130) Resp:  [13-20] 13 (06/03 2130) BP: (145-150)/(84-86) 145/84 (06/03 2130) SpO2:  [97 %-100 %] 97 % (06/03 2130) Weight:  [99 kg] 99 kg (06/03 2011) General:  Alert and oriented, No acute distress HEENT: Normocephalic, atraumatic Neck: No JVD or lymphadenopathy Cardiovascular: Regular rate  Lungs: Normal inspiratory/expiratory excursion Extremities: No edema Neurologic: Grossly intact  Laboratory Data:  Recent Labs    12/10/21 2015  WBC 16.0*  HGB 15.6*  HCT 45.0  PLT 308    Recent Labs    12/10/21 2015  NA 137  K 3.4*  CL 105  GLUCOSE 218*  BUN 12  CALCIUM 9.5  CREATININE 0.75     Results for orders placed or performed during the hospital encounter of 12/10/21 (from the past 24 hour(s))  Urinalysis, Routine w reflex microscopic Urine, Clean Catch     Status: Abnormal   Collection Time: 12/10/21  8:12 PM  Result Value Ref Range   Color, Urine AMBER (A) YELLOW   APPearance CLOUDY (A) CLEAR   Specific Gravity, Urine 1.026 1.005 - 1.030  pH 7.0 5.0 - 8.0   Glucose, UA NEGATIVE NEGATIVE mg/dL   Hgb urine dipstick LARGE (A) NEGATIVE   Bilirubin Urine NEGATIVE NEGATIVE   Ketones, ur 20 (A) NEGATIVE mg/dL   Protein, ur 100 (A) NEGATIVE mg/dL   Nitrite NEGATIVE NEGATIVE   Leukocytes,Ua TRACE (A) NEGATIVE   RBC / HPF >50 (H) 0 - 5 RBC/hpf   WBC, UA 21-50 0 - 5 WBC/hpf   Bacteria, UA NONE SEEN NONE SEEN   Squamous Epithelial / LPF 0-5 0 - 5   Mucus PRESENT   Basic metabolic panel     Status: Abnormal   Collection Time: 12/10/21  8:15 PM  Result Value Ref Range   Sodium 137 135 - 145 mmol/L    Potassium 3.4 (L) 3.5 - 5.1 mmol/L   Chloride 105 98 - 111 mmol/L   CO2 21 (L) 22 - 32 mmol/L   Glucose, Bld 218 (H) 70 - 99 mg/dL   BUN 12 6 - 20 mg/dL   Creatinine, Ser 0.75 0.44 - 1.00 mg/dL   Calcium 9.5 8.9 - 10.3 mg/dL   GFR, Estimated >60 >60 mL/min   Anion gap 11 5 - 15  CBC     Status: Abnormal   Collection Time: 12/10/21  8:15 PM  Result Value Ref Range   WBC 16.0 (H) 4.0 - 10.5 K/uL   RBC 5.24 (H) 3.87 - 5.11 MIL/uL   Hemoglobin 15.6 (H) 12.0 - 15.0 g/dL   HCT 45.0 36.0 - 46.0 %   MCV 85.9 80.0 - 100.0 fL   MCH 29.8 26.0 - 34.0 pg   MCHC 34.7 30.0 - 36.0 g/dL   RDW 11.8 11.5 - 15.5 %   Platelets 308 150 - 400 K/uL   nRBC 0.0 0.0 - 0.2 %   No results found for this or any previous visit (from the past 240 hour(s)).  Renal Function: Recent Labs    12/10/21 2015  CREATININE 0.75   Estimated Creatinine Clearance: 100 mL/min (by C-G formula based on SCr of 0.75 mg/dL).  Radiologic Imaging: CT ABDOMEN PELVIS W CONTRAST  Result Date: 12/10/2021 CLINICAL DATA:  Abdominal pain, acute, nonlocalized. States she was dx with a kidney stone 2-3 weeks ago. Pt also c/o nausea and a fever EXAM: CT ABDOMEN AND PELVIS WITH CONTRAST TECHNIQUE: Multidetector CT imaging of the abdomen and pelvis was performed using the standard protocol following bolus administration of intravenous contrast. RADIATION DOSE REDUCTION: This exam was performed according to the departmental dose-optimization program which includes automated exposure control, adjustment of the mA and/or kV according to patient size and/or use of iterative reconstruction technique. CONTRAST:  144m OMNIPAQUE IOHEXOL 300 MG/ML  SOLN COMPARISON:  None Available. FINDINGS: Lower chest: Left lower lobe linear atelectasis versus scarring. Hepatobiliary: No focal liver abnormality. No gallstones, gallbladder wall thickening, or pericholecystic fluid. No biliary dilatation. Pancreas: No focal lesion. Normal pancreatic contour. No  surrounding inflammatory changes. No main pancreatic ductal dilatation. Spleen: Normal in size without focal abnormality. Splenule is noted (2:26). Adrenals/Urinary Tract: No adrenal nodule bilaterally. Left perinephric fat stranding. Delayed left nephrogram. Urothelial thickening of the left collecting system at the level of the ureteropelvic junction. There is an 8 mm calcified stone at the left ureteropelvic junction with associated proximal mild to moderate hydronephrosis. The left ureter distally is normal in caliber. No right nephroureterolithiasis.  No right hydroureteronephrosis. Subcentimeter hypodensities too small to characterize. The urinary bladder is unremarkable. Stomach/Bowel: Stomach is within normal limits. No evidence of  bowel wall thickening or dilatation. Status post appendectomy. Unremarkable appendiceal stump. Vascular/Lymphatic: No abdominal aorta or iliac aneurysm. No abdominal, pelvic, or inguinal lymphadenopathy. Reproductive: Status post hysterectomy. No adnexal masses. Other: No intraperitoneal free fluid. No intraperitoneal free gas. No organized fluid collection. Musculoskeletal: No abdominal wall hernia or abnormality. No suspicious lytic or blastic osseous lesions. No acute displaced fracture. IMPRESSION: An 8 mm obstructive left ureterovesicular junction stone with findings suggestive of superimposed infection. Correlate with urinalysis. Electronically Signed   By: Iven Finn M.D.   On: 12/10/2021 22:42    I independently reviewed the above imaging studies.  Impression/Assessment:  Obstructing left upper ureteral stone with urinary tract infection  Plan:  Cystoscopy, left retrograde ureteropyelogram, double-J stent placement.  I have discussed the procedure with the patient, the fact that we will not remove the stone at this point, just provide drainage for the left renal unit with eventual treatment once infection has been adequately treated.  She understands and  desires to proceed.

## 2021-12-10 NOTE — H&P (Signed)
History and Physical    Patient: Dana Lynch XAJ:[id number] DOB: 10/02/1970 DOA: 12/10/2021 DOS: the patient was seen and examined on 12/11/2021 PCP: Audley Hose, MD  Patient coming from: Home  Chief Complaint:  Chief Complaint  Patient presents with   Flank Pain   HPI: Dana Lynch is a 51 y.o. female with medical history significant of hypertension, hyperlipidemia, T2DM who presents to the emergency department due to 1 day onset of left-sided flank pain.  She states that she was treated with antibiotics for UTI at an urgent care about 4 to 5 weeks ago with subsequent improvement. About 2 to 3 weeks ago she started to present with similar symptoms, she returned to the urgent care, CT abdomen was done and she was told that she had a kidney stone and a UTI, antibiotics were given and symptoms resolved.  She complained of feeling weak since the beginning of this week, 3 days ago, she had an onset of diarrhea with several episodes of watery and nonbloody diarrhea daily which resolved yesterday and today, she presented with left flank pain which subsequently advanced to generalized abdominal pain, associated with nonbloody vomiting, she complained of subjective fever due to having chills and diaphoresis, so she decided to go to the ED for further evaluation and management.  ED Course:  In the emergency department, he was hemodynamically stable.  BP was 150/86 and other vital signs are within normal range.  Work-up in the ED showed leukocytosis, hypokalemia, hyperglycemia, urinalysis was positive for hematuria, proteinuria, trace leukocytes and 21-50 WBC. CT abdomen and pelvis with contrast showed an 8 mm obstructive left ureterovesicular junction stone with findings suggestive of superimposed infection She was treated with IV morphine, Zofran, potassium was replenished and IV hydration was provided.  Patient was started on IV ceftriaxone due to UTI.  Urologist was consulted and  recommended admitting patient with plan to follow-up with patient in the morning.  Hospitalist was asked to admit patient for further evaluation and management.  Review of Systems: Review of systems as noted in the HPI. All other systems reviewed and are negative.   Past Medical History:  Diagnosis Date   Bowel habit changes 12/22/2013   Alternates constipation and diarrhea. Will refer to GI    Diabetes ([place]) 02/16/2014   Fibroids 12/22/2013   Hypertension    PONV (postoperative nausea and vomiting)    Unspecified symptom associated with female genital organs 12/22/2013   Past Surgical History:  Procedure Laterality Date   ABDOMINAL HYSTERECTOMY N/A 05/20/2019   Procedure: SUPRACERVICAL ABDOMINAL HYSTERECTOMY WITH LEFT SALPINGECTOMY;  Surgeon: Jonnie Kind, MD;  Location: [facility];  Service: Gynecology;  Laterality: N/A;   APPENDECTOMY     BREAST BIOPSY Left    ENDOMETRIAL ABLATION     TONSILLECTOMY AND ADENOIDECTOMY      Social History:  reports that she has never smoked. She has never used smokeless tobacco. She reports current alcohol use. She reports that she does not use drugs.   No Known Allergies  Family History  Problem Relation Age of Onset   Diabetes Mother    Diabetes Maternal Grandmother    Hypertension Maternal Grandmother    Cancer Maternal Grandfather        prostate   Heart disease Paternal Grandmother    Diabetes Paternal Grandmother    Heart disease Paternal Grandfather    Breast cancer Maternal Aunt      Prior to Admission medications   Medication Sig Start Date End  Date Taking? Authorizing Provider  blood glucose meter kit and supplies KIT Dispense based on patient and insurance preference. Use up to four times daily as directed. (FOR ICD-10 E11.65) 03/13/19   Cassandria Anger, MD  Blood Glucose Monitoring Suppl (ONETOUCH VERIO) w/Device KIT 1 each by Does not apply route as needed. 03/12/19   Cassandria Anger, MD  Cholecalciferol 125 MCG (5000  UT) TABS Take by mouth.    [provider]  glipiZIDE (GLUCOTROL XL) 5 MG 24 hr tablet Take 2 tablets (10 mg total) by mouth daily with breakfast. 05/12/19   Nida, Marella Chimes, MD  glucose blood (ONETOUCH VERIO) test strip Use as instructed 03/12/19   Cassandria Anger, MD  hydrochlorothiazide (HYDRODIURIL) 25 MG tablet Take 1 tablet (25 mg total) by mouth daily. For postop fluid retention. 05/27/19   Jonnie Kind, MD  lisinopril (ZESTRIL) 5 MG tablet TAKE 1 TABLET(5 MG) BY MOUTH DAILY 07/08/19   Derrek Monaco A, NP  metFORMIN (GLUCOPHAGE-XR) 500 MG 24 hr tablet TAKE 1 TABLET(500 MG) BY MOUTH TWICE DAILY AFTER A MEAL 10/08/19   Nida, Marella Chimes, MD  Meth-Hyo-M Barnett Hatter Phos-Ph Sal (URIBEL) 118 MG CAPS Take 1 capsule (118 mg total) by mouth in the morning, at noon, and at bedtime. 09/03/20   Chancy Milroy, MD  nitrofurantoin, macrocrystal-monohydrate, (MACROBID) 100 MG capsule 1 tablet bid x 7 days and then 1 tablet qhs until gone 09/03/20   Chancy Milroy, MD  simvastatin (ZOCOR) 20 MG tablet Take 1 tablet (20 mg total) by mouth daily at 6 PM. 05/12/19   Cassandria Anger, MD    Physical Exam: BP (!) 145/84   Pulse 76   Temp 98.1 F (36.7 C) (Oral)   Resp 13   Ht 5' 8"  (1.727 m)   Wt 99 kg   LMP  (LMP Unknown)   SpO2 97%   BMI 33.19 kg/m   General: 51 y.o. year-old female well developed well nourished in no acute distress.  Alert and oriented x3. HEENT: NCAT, EOMI Neck: Supple, trachea medial Cardiovascular: Regular rate and rhythm with no rubs or gallops.  No thyromegaly or JVD noted.  No lower extremity edema. 2/4 pulses in all 4 extremities. Respiratory: Clear to auscultation with no wheezes or rales. Good inspiratory effort. Abdomen: Left CVA tenderness.  Soft, tender to palpation of the abdomen, nondistended with normal bowel sounds x4 quadrants. Muskuloskeletal: No cyanosis, clubbing or edema noted bilaterally Neuro: CN II-XII intact, strength 5/5 x  4, sensation, reflexes intact Skin: No ulcerative lesions noted or rashes Psychiatry: Judgement and insight appear normal. Mood is appropriate for condition and setting          Labs on Admission:  Basic Metabolic Panel: Recent Labs  Lab 12/10/21 2015  NA 137  K 3.4*  CL 105  CO2 21*  GLUCOSE 218*  BUN 12  CREATININE 0.75  CALCIUM 9.5   Liver Function Tests: No results for input(s): AST, ALT, ALKPHOS, BILITOT, PROT, ALBUMIN in the last 168 hours. No results for input(s): LIPASE, AMYLASE in the last 168 hours. No results for input(s): AMMONIA in the last 168 hours. CBC: Recent Labs  Lab 12/10/21 2015  WBC 16.0*  HGB 15.6*  HCT 45.0  MCV 85.9  PLT 308   Cardiac Enzymes: No results for input(s): CKTOTAL, CKMB, CKMBINDEX, TROPONINI in the last 168 hours.  BNP (last 3 results) No results for input(s): BNP in the last 8760 hours.  ProBNP (last 3  results) No results for input(s): PROBNP in the last 8760 hours.  CBG: No results for input(s): GLUCAP in the last 168 hours.  Radiological Exams on Admission: CT ABDOMEN PELVIS W CONTRAST  Result Date: 12/10/2021 CLINICAL DATA:  Abdominal pain, acute, nonlocalized. States she was dx with a kidney stone 2-3 weeks ago. Pt also c/o nausea and a fever EXAM: CT ABDOMEN AND PELVIS WITH CONTRAST TECHNIQUE: Multidetector CT imaging of the abdomen and pelvis was performed using the standard protocol following bolus administration of intravenous contrast. RADIATION DOSE REDUCTION: This exam was performed according to the departmental dose-optimization program which includes automated exposure control, adjustment of the mA and/or kV according to patient size and/or use of iterative reconstruction technique. CONTRAST:  162m OMNIPAQUE IOHEXOL 300 MG/ML  SOLN COMPARISON:  None Available. FINDINGS: Lower chest: Left lower lobe linear atelectasis versus scarring. Hepatobiliary: No focal liver abnormality. No gallstones, gallbladder wall  thickening, or pericholecystic fluid. No biliary dilatation. Pancreas: No focal lesion. Normal pancreatic contour. No surrounding inflammatory changes. No main pancreatic ductal dilatation. Spleen: Normal in size without focal abnormality. Splenule is noted (2:26). Adrenals/Urinary Tract: No adrenal nodule bilaterally. Left perinephric fat stranding. Delayed left nephrogram. Urothelial thickening of the left collecting system at the level of the ureteropelvic junction. There is an 8 mm calcified stone at the left ureteropelvic junction with associated proximal mild to moderate hydronephrosis. The left ureter distally is normal in caliber. No right nephroureterolithiasis.  No right hydroureteronephrosis. Subcentimeter hypodensities too small to characterize. The urinary bladder is unremarkable. Stomach/Bowel: Stomach is within normal limits. No evidence of bowel wall thickening or dilatation. Status post appendectomy. Unremarkable appendiceal stump. Vascular/Lymphatic: No abdominal aorta or iliac aneurysm. No abdominal, pelvic, or inguinal lymphadenopathy. Reproductive: Status post hysterectomy. No adnexal masses. Other: No intraperitoneal free fluid. No intraperitoneal free gas. No organized fluid collection. Musculoskeletal: No abdominal wall hernia or abnormality. No suspicious lytic or blastic osseous lesions. No acute displaced fracture. IMPRESSION: An 8 mm obstructive left ureterovesicular junction stone with findings suggestive of superimposed infection. Correlate with urinalysis. Electronically Signed   By: MIven FinnM.D.   On: 12/10/2021 22:42    EKG: I independently viewed the EKG done and my findings are as followed: Normal sinus rhythm at a rate of 89 bpm  Assessment/Plan Present on Admission:  Mixed hyperlipidemia  Essential hypertension, benign  Principal Problem:   Left nephrolithiasis Active Problems:   Essential hypertension, benign   Mixed hyperlipidemia   UTI (urinary tract  infection)   Leukocytosis   Nausea & vomiting   Type 2 diabetes mellitus with hyperglycemia (HCC)   Hypokalemia   Obesity (BMI 30-39.9)  Obstructing left-sided nephrolithiasis CT abdomen and pelvis showed 8 mm obstructive left ureterovesicular junction stone  Continue IV morphine 2 mg every 4 hours as needed Patient will be placed n.p.o. at midnight in anticipation for possible urological procedure in the morning Urologist was consulted and will see patient in the morning  UTI POA Continue IV ceftriaxone  Leukocytosis possibly secondary to above WBC 16.0, continue treatment as described above for UTI  Nausea and vomiting Continue Zofran.  Hypokalemia K= was 3.4, this was replenished  T2DM with hyperglycemia Continue ISS and hypoglycemia protocol  Obesity (BMI 33.19) Diet and lifestyle modification  Essential hypertension Consider starting home meds when patient resumes oral intake  Mixed hyperlipidemia Consider starting home statin when patient resumes oral intake   DVT prophylaxis: SCDs   Advance Care Planning:   Code Status: Prior   Consults:  Urology  Family Communication: Husband at bedside (all questions answered to satisfaction)  Severity of Illness: The appropriate patient status for this patient is INPATIENT. Inpatient status is judged to be reasonable and necessary in order to provide the required intensity of service to ensure the patient's safety. The patient's presenting symptoms, physical exam findings, and initial radiographic and laboratory data in the context of their chronic comorbidities is felt to place them at high risk for further clinical deterioration. Furthermore, it is not anticipated that the patient will be medically stable for discharge from the hospital within 2 midnights of admission.   * I certify that at the point of admission it is my clinical judgment that the patient will require inpatient hospital care spanning beyond 2 midnights  from the point of admission due to high intensity of service, high risk for further deterioration and high frequency of surveillance required.*  Author: Bernadette Hoit, DO 12/11/2021 12:00 AM  For on call review [email or web address].

## 2021-12-11 ENCOUNTER — Inpatient Hospital Stay (HOSPITAL_COMMUNITY): Payer: BC Managed Care – PPO | Admitting: Anesthesiology

## 2021-12-11 ENCOUNTER — Encounter (HOSPITAL_COMMUNITY)
Admission: EM | Disposition: A | Discharge status: Home / Self Care | Payer: Self-pay | Source: Home / Self Care | Attending: Internal Medicine

## 2021-12-11 ENCOUNTER — Encounter (HOSPITAL_COMMUNITY): Payer: Self-pay | Admitting: Internal Medicine

## 2021-12-11 ENCOUNTER — Inpatient Hospital Stay (HOSPITAL_COMMUNITY): Payer: BC Managed Care – PPO

## 2021-12-11 DIAGNOSIS — E669 Obesity, unspecified: Secondary | ICD-10-CM

## 2021-12-11 DIAGNOSIS — E876 Hypokalemia: Secondary | ICD-10-CM

## 2021-12-11 DIAGNOSIS — N200 Calculus of kidney: Secondary | ICD-10-CM | POA: Diagnosis not present

## 2021-12-11 DIAGNOSIS — E782 Mixed hyperlipidemia: Secondary | ICD-10-CM | POA: Diagnosis not present

## 2021-12-11 DIAGNOSIS — I10 Essential (primary) hypertension: Secondary | ICD-10-CM | POA: Diagnosis not present

## 2021-12-11 HISTORY — PX: CYSTOSCOPY WITH STENT PLACEMENT: SHX5790

## 2021-12-11 LAB — CBC
HCT: 42.6 % (ref 36.0–46.0)
Hemoglobin: 14.6 g/dL (ref 12.0–15.0)
MCH: 29.9 pg (ref 26.0–34.0)
MCHC: 34.3 g/dL (ref 30.0–36.0)
MCV: 87.3 fL (ref 80.0–100.0)
Platelets: 247 10*3/uL (ref 150–400)
RBC: 4.88 MIL/uL (ref 3.87–5.11)
RDW: 11.9 % (ref 11.5–15.5)
WBC: 17.4 10*3/uL — ABNORMAL HIGH (ref 4.0–10.5)
nRBC: 0 % (ref 0.0–0.2)

## 2021-12-11 LAB — COMPREHENSIVE METABOLIC PANEL
ALT: 37 U/L (ref 0–44)
AST: 22 U/L (ref 15–41)
Albumin: 3.8 g/dL (ref 3.5–5.0)
Alkaline Phosphatase: 56 U/L (ref 38–126)
Anion gap: 8 (ref 5–15)
BUN: 13 mg/dL (ref 6–20)
CO2: 25 mmol/L (ref 22–32)
Calcium: 8.9 mg/dL (ref 8.9–10.3)
Chloride: 106 mmol/L (ref 98–111)
Creatinine, Ser: 0.88 mg/dL (ref 0.44–1.00)
GFR, Estimated: >60 mL/min (ref >60)
Glucose, Bld: 144 mg/dL — ABNORMAL HIGH (ref 70–99)
Potassium: 3.6 mmol/L (ref 3.5–5.1)
Sodium: 139 mmol/L (ref 135–145)
Total Bilirubin: 0.6 mg/dL (ref 0.3–1.2)
Total Protein: 6.6 g/dL (ref 6.5–8.1)

## 2021-12-11 LAB — HIV ANTIBODY (ROUTINE TESTING W REFLEX): HIV Screen 4th Generation wRfx: NONREACTIVE

## 2021-12-11 LAB — GLUCOSE, CAPILLARY
Glucose-Capillary: 163 mg/dL — ABNORMAL HIGH (ref 70–99)
Glucose-Capillary: 124 mg/dL — ABNORMAL HIGH (ref 70–99)
Glucose-Capillary: 127 mg/dL — ABNORMAL HIGH (ref 70–99)
Glucose-Capillary: 117 mg/dL — ABNORMAL HIGH (ref 70–99)
Glucose-Capillary: 183 mg/dL — ABNORMAL HIGH (ref 70–99)
Glucose-Capillary: 150 mg/dL — ABNORMAL HIGH (ref 70–99)

## 2021-12-11 LAB — CBG MONITORING, ED: Glucose-Capillary: 139 mg/dL — ABNORMAL HIGH (ref 70–99)

## 2021-12-11 LAB — MRSA NEXT GEN BY PCR, NASAL: MRSA by PCR Next Gen: NOT DETECTED

## 2021-12-11 LAB — HEMOGLOBIN A1C
Hgb A1c MFr Bld: 7.2 % — ABNORMAL HIGH (ref 4.8–5.6)
Mean Plasma Glucose: 159.94 mg/dL

## 2021-12-11 LAB — MAGNESIUM: Magnesium: 1.8 mg/dL (ref 1.7–2.4)

## 2021-12-11 LAB — PHOSPHORUS: Phosphorus: 5.3 mg/dL — ABNORMAL HIGH (ref 2.5–4.6)

## 2021-12-11 SURGERY — CYSTOSCOPY, WITH STENT INSERTION
Anesthesia: General | Site: Ureter | Laterality: Left

## 2021-12-11 MED ORDER — LACTATED RINGERS IV SOLN: INTRAVENOUS | Status: DC

## 2021-12-11 MED ORDER — STERILE WATER FOR IRRIGATION IR SOLN
Status: DC | PRN
  Administered 2021-12-11: 3000 mL

## 2021-12-11 MED ORDER — LACTATED RINGERS IV SOLN: INTRAVENOUS | Status: DC | PRN

## 2021-12-11 MED ORDER — ONDANSETRON HCL 4 MG/2ML IJ SOLN
INTRAMUSCULAR | Status: DC | PRN
  Administered 2021-12-11: 4 mg via INTRAVENOUS

## 2021-12-11 MED ORDER — FENTANYL CITRATE (PF) 100 MCG/2ML IJ SOLN
INTRAMUSCULAR | Status: AC
  Filled 2021-12-11: qty 2

## 2021-12-11 MED ORDER — PROPOFOL 10 MG/ML IV BOLUS
INTRAVENOUS | Status: AC
  Filled 2021-12-11: qty 20

## 2021-12-11 MED ORDER — DIATRIZOATE MEGLUMINE 30 % UR SOLN
URETHRAL | Status: DC | PRN
  Administered 2021-12-11: 10 mL via URETHRAL

## 2021-12-11 MED ORDER — FENTANYL CITRATE (PF) 100 MCG/2ML IJ SOLN
INTRAMUSCULAR | Status: DC | PRN
Start: 2021-12-11 — End: 2021-12-11
  Administered 2021-12-11 (×2): 50 ug via INTRAVENOUS

## 2021-12-11 MED ORDER — FENTANYL CITRATE PF 50 MCG/ML IJ SOSY: 25.0000 ug | PREFILLED_SYRINGE | INTRAMUSCULAR | Status: DC | PRN

## 2021-12-11 MED ORDER — DIATRIZOATE MEGLUMINE 30 % UR SOLN
URETHRAL | Status: AC
  Filled 2021-12-11: qty 100

## 2021-12-11 MED ORDER — SODIUM CHLORIDE 0.9 % IV SOLN: INTRAVENOUS | Status: AC

## 2021-12-11 MED ORDER — INSULIN ASPART 100 UNIT/ML IJ SOLN: 0.0000 [IU] | Freq: Every day | INTRAMUSCULAR | Status: DC

## 2021-12-11 MED ORDER — ONDANSETRON HCL 4 MG/2ML IJ SOLN: 4.0000 mg | Freq: Once | INTRAMUSCULAR | Status: DC | PRN

## 2021-12-11 MED ORDER — CHLORHEXIDINE GLUCONATE CLOTH 2 % EX PADS
6.0000 | MEDICATED_PAD | Freq: Every day | CUTANEOUS | Status: DC
Start: 2021-12-11 — End: 2021-12-12
  Administered 2021-12-11 – 2021-12-12 (×2): 6 via TOPICAL

## 2021-12-11 MED ORDER — INSULIN ASPART 100 UNIT/ML IJ SOLN
0.0000 [IU] | Freq: Three times a day (TID) | INTRAMUSCULAR | Status: DC
  Administered 2021-12-11 – 2021-12-12 (×3): 3 [IU] via SUBCUTANEOUS

## 2021-12-11 MED ORDER — PROPOFOL 10 MG/ML IV BOLUS
INTRAVENOUS | Status: DC | PRN
  Administered 2021-12-11: 200 mg via INTRAVENOUS

## 2021-12-11 MED ORDER — CHLORHEXIDINE GLUCONATE 0.12 % MT SOLN: 15.0000 mL | Freq: Once | OROMUCOSAL | Status: DC

## 2021-12-11 MED ORDER — ACETAMINOPHEN 325 MG PO TABS
650.0000 mg | ORAL_TABLET | Freq: Four times a day (QID) | ORAL | Status: DC | PRN
  Administered 2021-12-11 – 2021-12-12 (×2): 650 mg via ORAL
  Filled 2021-12-11 (×2): qty 2

## 2021-12-11 MED ORDER — ORAL CARE MOUTH RINSE: 15.0000 mL | Freq: Once | OROMUCOSAL | Status: DC

## 2021-12-11 SURGICAL SUPPLY — 19 items
BAG DRAIN URO TABLE W/ADPT NS (BAG) ×2 IMPLANT
BAG DRN 8 ADPR NS SKTRN CSTL (BAG) ×1
CLOTH BEACON ORANGE TIMEOUT ST (SAFETY) ×2 IMPLANT
GLOVE BIOGEL M 8.0 STRL (GLOVE) ×2 IMPLANT
GLOVE BIOGEL PI IND STRL 7.0 (GLOVE) ×2 IMPLANT
GLOVE BIOGEL PI INDICATOR 7.0 (GLOVE) ×2
GOWN STRL REUS W/ TWL LRG LVL3 (GOWN DISPOSABLE) ×1 IMPLANT
GOWN STRL REUS W/TWL LRG LVL3 (GOWN DISPOSABLE) ×2
GOWN STRL REUS W/TWL XL LVL3 (GOWN DISPOSABLE) ×2 IMPLANT
GUIDEWIRE STR DUAL SENSOR (WIRE) ×1 IMPLANT
KIT TURNOVER CYSTO (KITS) ×2 IMPLANT
MANIFOLD NEPTUNE II (INSTRUMENTS) ×2 IMPLANT
NS IRRIG 500ML POUR BTL (IV SOLUTION) IMPLANT
PACK CYSTO (CUSTOM PROCEDURE TRAY) ×2 IMPLANT
PAD ARMBOARD 7.5X6 YLW CONV (MISCELLANEOUS) ×2 IMPLANT
STENT CONTOUR 6FRX26X.038 (STENTS) ×1 IMPLANT
TOWEL OR 17X26 4PK STRL BLUE (TOWEL DISPOSABLE) ×2 IMPLANT
WATER STERILE IRR 1000ML POUR (IV SOLUTION) ×1 IMPLANT
WATER STERILE IRR 3000ML UROMA (IV SOLUTION) ×2 IMPLANT

## 2021-12-11 NOTE — Hospital Course (Signed)
51 year old female with a history of diabetes mellitus type 2, hypertension, hyperlipidemia presenting with left flank pain for over a month.  The patient was initially diagnosed with UTI about 4 to 5 weeks prior to this admission.  She was treated with a course of antibiotics and improved initially.  About 2 weeks later, the patient began developing left flank pain once again with some dysuria.  She went to [facility] ED and a CT of the abdomen and pelvis was obtained on 11/24/2021.  It showed a 7 mm calculus at the left UPJ without hydronephrosis.  The patient was given another course of antibiotics and she had some clinical improvement.  However, her left leg pain recurred with some generalized weakness about 3 days prior to this admission.  She had some nausea and vomiting.  Because of the severity of her symptoms she presented for further evaluation.  She has some subjective fevers and chills.  She denied any headache, chest pain, shortness breath, coughing, hemoptysis. In the ED, the patient had low-grade temperature 99.3 F.  She was hemodynamically stable with oxygen saturation 100% on room air.  WBC to 16.0, hemoglobin 15.6, platelets 308.   BMP showed sodium of 127, potassium 4, serum creatinine 0.75.  CT of the abdomen and pelvis showed an 8 mm stone at the left UPJ with mild proximal hydronephrosis on the left.  Urology was consulted.  Dr. Zannie Cove placed a double-J stent on the morning of 12/11/2021.

## 2021-12-11 NOTE — Assessment & Plan Note (Signed)
BMI 33.89 Lifestyle modification

## 2021-12-11 NOTE — Transfer of Care (Signed)
Immediate Anesthesia Transfer of Care Note  Patient: Dana Lynch  Procedure(s) Performed: CYSTOSCOPY WITH STENT PLACEMENT (Left)  Patient Location: PACU  Anesthesia Type:General  Level of Consciousness: awake and alert   Airway & Oxygen Therapy: Patient Spontanous Breathing  Post-op Assessment: Report given to RN  Post vital signs: Reviewed and stable  Last Vitals:  Vitals Value Taken Time  BP    Temp    Pulse    Resp    SpO2      Last Pain:  Vitals:   12/11/21 0246  TempSrc:   PainSc: 0-No pain         Complications: No notable events documented.

## 2021-12-11 NOTE — Assessment & Plan Note (Signed)
Continue statin. 

## 2021-12-11 NOTE — Discharge Summary (Signed)
Physician Discharge Summary   Patient: Dana Lynch MRN: [id number] DOB: 09-May-1971  Admit date:     12/10/2021  Discharge date: 12/12/2021  Discharge Physician: Shanon Brow Masiel Gentzler   PCP: Audley Hose, MD   Recommendations at discharge:   Please follow up with primary care provider within 1-2 weeks  Please repeat BMP and CBC in one week     Hospital Course: 51 year old female with a history of diabetes mellitus type 2, hypertension, hyperlipidemia presenting with left flank pain for over a month.  The patient was initially diagnosed with UTI about 4 to 5 weeks prior to this admission.  She was treated with a course of antibiotics and improved initially.  About 2 weeks later, the patient began developing left flank pain once again with some dysuria.  She went to [facility] ED and a CT of the abdomen and pelvis was obtained on 11/24/2021.  It showed a 7 mm calculus at the left UPJ without hydronephrosis.  The patient was given another course of antibiotics and she had some clinical improvement.  However, her left leg pain recurred with some generalized weakness about 3 days prior to this admission.  She had some nausea and vomiting.  Because of the severity of her symptoms she presented for further evaluation.  She has some subjective fevers and chills.  She denied any headache, chest pain, shortness breath, coughing, hemoptysis. In the ED, the patient had low-grade temperature 99.3 F.  She was hemodynamically stable with oxygen saturation 100% on room air.  WBC to 16.0, hemoglobin 15.6, platelets 308.   BMP showed sodium of 127, potassium 4, serum creatinine 0.75.  CT of the abdomen and pelvis showed an 8 mm stone at the left UPJ with mild proximal hydronephrosis on the left.  Urology was consulted.  Dr. Zannie Cove placed a double-J stent on the morning of 12/11/2021.  Assessment and Plan: * Left nephrolithiasis 12/10/2021 CT abdomen and pelvis as discussed above [facility], Dr. Diona Fanti --  12/11/2021--double-J stent placement --follow up in office 1-2 weeks for definitive stone management  Obesity (BMI 30-39.9) BMI 33.89 Lifestyle modification  Hypokalemia Repleted  Type 2 diabetes mellitus with hyperglycemia (HCC) Holding glipizide and metformin>>resume after d/c Patient takes Basaglar 10 units at bedtime NovoLog sliding scale  UTI (urinary tract infection) UA shows 21-50 WBC -Continue empiric ceftriaxone pending urine culture data --d/c home with 8 more days cefdinir  Mixed hyperlipidemia Continue statin  Essential hypertension, benign Holding HCTZ and lisinopril temporarily>>resume after discharge      {Tip this will not be part of the note when signed Body mass index is 33.89 kg/m. , ,  (Optional):26781}  {(NOTE) Pain control PDMP Statment (Optional):26782} Consultants: urology Procedures performed:  Cystoscopy, left retrograde ureteropyelogram; double JJ stent Disposition: Home Diet recommendation:  Carb modified diet DISCHARGE MEDICATION: Allergies as of 12/11/2021   No Known Allergies      Medication List     STOP taking these medications    nitrofurantoin (macrocrystal-monohydrate) 100 MG capsule Commonly known as: MACROBID       TAKE these medications    blood glucose meter kit and supplies Kit Dispense based on patient and insurance preference. Use up to four times daily as directed. (FOR ICD-10 E11.65)   Cholecalciferol 125 MCG (5000 UT) Tabs Take by mouth.   glipiZIDE 5 MG 24 hr tablet Commonly known as: GLUCOTROL XL Take 2 tablets (10 mg total) by mouth daily with breakfast.   hydrochlorothiazide 25 MG tablet Commonly known as:  HYDRODIURIL Take 1 tablet (25 mg total) by mouth daily. For postop fluid retention.   lisinopril 5 MG tablet Commonly known as: ZESTRIL TAKE 1 TABLET(5 MG) BY MOUTH DAILY   metFORMIN 500 MG 24 hr tablet Commonly known as: GLUCOPHAGE-XR TAKE 1 TABLET(500 MG) BY MOUTH TWICE DAILY AFTER A MEAL    OneTouch Verio test strip Generic drug: glucose blood Use as instructed   OneTouch Verio w/Device Kit 1 each by Does not apply route as needed.   simvastatin 20 MG tablet Commonly known as: ZOCOR Take 1 tablet (20 mg total) by mouth daily at 6 PM.   Uribel 118 MG Caps Take 1 capsule (118 mg total) by mouth in the morning, at noon, and at bedtime.        Follow-up Information     [facility] [place] Follow up.   Specialty: Urology Why: We will call you to set up a follow-up appointment Contact information: [address] [place] [phone]               Discharge Exam: Danley Danker Weights   12/10/21 2011 12/11/21 0209  Weight: 99 kg 101.1 kg   HEENT:  [place]/AT, No thrush, no icterus CV:  RRR, no rub, no S3, no S4 Lung:  CTA, no wheeze, no rhonchi Abd:  soft/+BS, NT Ext:  No edema, no lymphangitis, no synovitis, no rash   Condition at discharge: stable  The results of significant diagnostics from this hospitalization (including imaging, microbiology, ancillary and laboratory) are listed below for reference.   Imaging Studies: CT ABDOMEN PELVIS W CONTRAST  Result Date: 12/10/2021 CLINICAL DATA:  Abdominal pain, acute, nonlocalized. States she was dx with a kidney stone 2-3 weeks ago. Pt also c/o nausea and a fever EXAM: CT ABDOMEN AND PELVIS WITH CONTRAST TECHNIQUE: Multidetector CT imaging of the abdomen and pelvis was performed using the standard protocol following bolus administration of intravenous contrast. RADIATION DOSE REDUCTION: This exam was performed according to the departmental dose-optimization program which includes automated exposure control, adjustment of the mA and/or kV according to patient size and/or use of iterative reconstruction technique. CONTRAST:  133m OMNIPAQUE IOHEXOL 300 MG/ML  SOLN COMPARISON:  None Available. FINDINGS: Lower chest: Left lower lobe linear atelectasis versus scarring.  Hepatobiliary: No focal liver abnormality. No gallstones, gallbladder wall thickening, or pericholecystic fluid. No biliary dilatation. Pancreas: No focal lesion. Normal pancreatic contour. No surrounding inflammatory changes. No main pancreatic ductal dilatation. Spleen: Normal in size without focal abnormality. Splenule is noted (2:26). Adrenals/Urinary Tract: No adrenal nodule bilaterally. Left perinephric fat stranding. Delayed left nephrogram. Urothelial thickening of the left collecting system at the level of the ureteropelvic junction. There is an 8 mm calcified stone at the left ureteropelvic junction with associated proximal mild to moderate hydronephrosis. The left ureter distally is normal in caliber. No right nephroureterolithiasis.  No right hydroureteronephrosis. Subcentimeter hypodensities too small to characterize. The urinary bladder is unremarkable. Stomach/Bowel: Stomach is within normal limits. No evidence of bowel wall thickening or dilatation. Status post appendectomy. Unremarkable appendiceal stump. Vascular/Lymphatic: No abdominal aorta or iliac aneurysm. No abdominal, pelvic, or inguinal lymphadenopathy. Reproductive: Status post hysterectomy. No adnexal masses. Other: No intraperitoneal free fluid. No intraperitoneal free gas. No organized fluid collection. Musculoskeletal: No abdominal wall hernia or abnormality. No suspicious lytic or blastic osseous lesions. No acute displaced fracture. IMPRESSION: An 8 mm obstructive left ureterovesicular junction stone with findings suggestive of superimposed infection. Correlate with urinalysis. Electronically Signed  By: Iven Finn M.D.   On: 12/10/2021 22:42   DG C-Arm 1-60 Min-No Report  Result Date: 12/11/2021 Fluoroscopy was utilized by the requesting physician.  No radiographic interpretation.    Microbiology: Results for orders placed or performed during the hospital encounter of 12/10/21  MRSA Next Gen by PCR, Nasal     Status:  None   Collection Time: 12/11/21  2:08 AM   Specimen: Nasal Mucosa; Nasal Swab  Result Value Ref Range Status   MRSA by PCR Next Gen NOT DETECTED NOT DETECTED Final    Comment: (NOTE) The GeneXpert MRSA Assay (FDA approved for NASAL specimens only), is one component of a comprehensive MRSA colonization surveillance program. It is not intended to diagnose MRSA infection nor to guide or monitor treatment for MRSA infections. Test performance is not FDA approved in patients less than 30 years old. Performed at [facility], [address] [place], [place] [address]     Labs: CBC: Recent Labs  Lab 12/10/21 2015 12/11/21 0411  WBC 16.0* 17.4*  HGB 15.6* 14.6  HCT 45.0 42.6  MCV 85.9 87.3  PLT 308 716   Basic Metabolic Panel: Recent Labs  Lab 12/10/21 2015 12/11/21 0411  NA 137 139  K 3.4* 3.6  CL 105 106  CO2 21* 25  GLUCOSE 218* 144*  BUN 12 13  CREATININE 0.75 0.88  CALCIUM 9.5 8.9  MG  --  1.8  PHOS  --  5.3*   Liver Function Tests: Recent Labs  Lab 12/11/21 0411  AST 22  ALT 37  ALKPHOS 56  BILITOT 0.6  PROT 6.6  ALBUMIN 3.8   CBG: Recent Labs  Lab 12/11/21 0436 12/11/21 0714 12/11/21 0755 12/11/21 1124 12/11/21 1608  GLUCAP 163* 124* 127* 117* 183*    Discharge time spent: greater than 30 minutes.  Signed: Orson Eva, MD [facility] 12/11/2021

## 2021-12-11 NOTE — Assessment & Plan Note (Addendum)
Holding glipizide and metformin>>resume after d/c Patient takes Basaglar 10 units at bedtime NovoLog sliding scale

## 2021-12-11 NOTE — Assessment & Plan Note (Addendum)
12/10/2021 CT abdomen and pelvis as discussed above [facility] urology, Dr. Diona Fanti -- 12/11/2021--double-J stent placement --follow up in office 1-2 weeks for definitive stone management

## 2021-12-11 NOTE — Assessment & Plan Note (Addendum)
Holding HCTZ and lisinopril temporarily>>resume after discharge

## 2021-12-11 NOTE — Anesthesia Postprocedure Evaluation (Signed)
Anesthesia Post Note  Patient: KEALANI LECKEY  Procedure(s) Performed: CYSTOSCOPY WITH STENT PLACEMENT (Left)  Patient location during evaluation: PACU Anesthesia Type: General Level of consciousness: awake and alert Pain management: pain level controlled Vital Signs Assessment: post-procedure vital signs reviewed and stable Respiratory status: spontaneous breathing, nonlabored ventilation, respiratory function stable and patient connected to nasal cannula oxygen Cardiovascular status: blood pressure returned to baseline and stable Postop Assessment: no apparent nausea or vomiting Anesthetic complications: no   No notable events documented.   Last Vitals:  Vitals:   12/11/21 0130 12/11/21 0209  BP: 123/77 (!) 152/64  Pulse: 78   Resp: 20 20  Temp: 36.7 C 37.4 C  SpO2: 96% 97%    Last Pain:  Vitals:   12/11/21 0246  TempSrc:   PainSc: 0-No pain                 Louann Sjogren

## 2021-12-11 NOTE — Interval H&P Note (Signed)
History and Physical Interval Note:  12/11/2021 6:54 AM  Dana Lynch  has presented today for surgery, with the diagnosis of renal stone left.  The various methods of treatment have been discussed with the patient and family. After consideration of risks, benefits and other options for treatment, the patient has consented to  Procedure(s): [place] (Left) as a surgical intervention.  The patient's history has been reviewed, patient examined, no change in status, stable for surgery.  I have reviewed the patient's chart and labs.  Questions were answered to the patient's satisfaction.     Lillette Boxer [name]

## 2021-12-11 NOTE — Op Note (Signed)
Preoperative diagnosis: Left proximal ureteral calculus with urinary tract infection  Postoperative diagnosis: Same  Principal procedure: Cystoscopy, left retrograde ureteropyelogram, fluoroscopic interpretation, placement of 6 French by 26 cm contour double-J stent without tether  Surgeon: Karne Ozga  Anesthesia: General with LMA  Complications: None  Specimen: None  Drains: None  Estimated blood loss: None  Indications: 51 year old female presenting last night with urinary tract infection, left flank pain and CT showed moderate to large left proximal ureteral stone.  She presents at this time for urgent decompression of her left renal unit with a stent in advance of eventual stone treatment.  I discussed the procedure with the patient, the reason we are doing this, expected outcomes and risks and complications.  She understands and desires to proceed.  Findings: Urothelium of the bladder was normal.  Ureteral orifices were normal.  There were no bladder calculi.  Retrograde study of the left ureter utilizing a 6 French open-ended catheter and Omnipaque revealed a normal distal and mid ureter.  There was a filling defect in the proximal ureter with, proximal to that, dilation of the ureter, as well as a pyelocalyceal system.  Description of procedure: The patient was properly identified and marked in the preoperative area.  She was taken to the operating room where general anesthetic was administered with the LMA.  She was placed in the dorsolithotomy position.  Genitalia and perineum were prepped, draped, proper timeout performed.  75 French panendoscope advanced into the bladder.  Circumferential inspection of the bladder was performed.  Retrograde study of the left ureter and pyelocalyceal system was performed using Omnipaque and a 6 [place] open-ended catheter with the above-mentioned findings.  Guidewire was advanced through the open-ended catheter, easily passed the obstructing stone  into the upper pole calyx.  26 cm x 6 French Contour double-J stent was advanced over top of this.  Once adequately positioned within the ureter, the guidewire was removed.  Excellent proximal and distal curl seen using fluoroscopy and cystoscopy, respectively.  Bladder drained, scope removed.  Procedure was terminated.  Patient was awakened, taken to the PACU in stable condition having tolerated procedure well.

## 2021-12-11 NOTE — Assessment & Plan Note (Addendum)
UA shows 21-50 WBC -Continue empiric ceftriaxone pending urine culture data --d/c home with 8 more days cefdinir

## 2021-12-11 NOTE — Assessment & Plan Note (Signed)
Repleted. °

## 2021-12-11 NOTE — Discharge Instructions (Signed)

## 2021-12-11 NOTE — Anesthesia Preprocedure Evaluation (Signed)
Anesthesia Evaluation  Patient identified by MRN, date of birth, ID band Patient awake    Reviewed: Allergy & Precautions, H&P , NPO status , Patient's Chart, lab work & pertinent test results, reviewed documented beta blocker date and time   History of Anesthesia Complications (+) PONV and history of anesthetic complications  Airway Mallampati: II  TM Distance: >3 FB Neck ROM: full    Dental no notable dental hx.    Pulmonary neg pulmonary ROS,    Pulmonary exam normal breath sounds clear to auscultation       Cardiovascular Exercise Tolerance: Good hypertension, negative cardio ROS   Rhythm:regular Rate:Normal     Neuro/Psych negative neurological ROS  negative psych ROS   GI/Hepatic negative GI ROS, Neg liver ROS,   Endo/Other  negative endocrine ROSdiabetes, Type 2  Renal/GU Renal disease  negative genitourinary   Musculoskeletal   Abdominal   Peds  Hematology negative hematology ROS (+)   Anesthesia Other Findings   Reproductive/Obstetrics negative OB ROS                             Anesthesia Physical  Anesthesia Plan  ASA: 2 and emergent  Anesthesia Plan: General and General LMA   Post-op Pain Management:    Induction:   PONV Risk Score and Plan:   Airway Management Planned:   Additional Equipment:   Intra-op Plan:   Post-operative Plan:   Informed Consent: I have reviewed the patients History and Physical, chart, labs and discussed the procedure including the risks, benefits and alternatives for the proposed anesthesia with the patient or authorized representative who has indicated his/her understanding and acceptance.     Dental Advisory Given  Plan Discussed with: CRNA  Anesthesia Plan Comments:         Anesthesia Quick Evaluation  

## 2021-12-11 NOTE — Progress Notes (Signed)
PROGRESS NOTE  Dana Lynch ASN:[id number] DOB: [birth year] DOA: 12/10/2021 PCP: Audley Hose, MD  Brief History:  51 year old female with a history of diabetes mellitus type 2, hypertension, hyperlipidemia presenting with left flank pain for over a month.  The patient was initially diagnosed with UTI about 4 to 5 weeks prior to this admission.  She was treated with a course of antibiotics and improved initially.  About 2 weeks later, the patient began developing left flank pain once again with some dysuria.  She went to [facility] ED and a CT of the abdomen and pelvis was obtained on 11/24/2021.  It showed a 7 mm calculus at the left UPJ without hydronephrosis.  The patient was given another course of antibiotics and she had some clinical improvement.  However, her left leg pain recurred with some generalized weakness about 3 days prior to this admission.  She had some nausea and vomiting.  Because of the severity of her symptoms she presented for further evaluation.  She has some subjective fevers and chills.  She denied any headache, chest pain, shortness breath, coughing, hemoptysis. In the ED, the patient had low-grade temperature 99.3 F.  She was hemodynamically stable with oxygen saturation 100% on room air.  WBC to 16.0, hemoglobin 15.6, platelets 308.   BMP showed sodium of 127, potassium 4, serum creatinine 0.75.  CT of the abdomen and pelvis showed an 8 mm stone at the left UPJ with mild proximal hydronephrosis on the left.  Urology was consulted.  Dr. Zannie Cove placed a double-J stent on the morning of 12/11/2021.    Assessment and Plan: * Left nephrolithiasis 12/10/2021 CT abdomen and pelvis as discussed above [facility], Dr. Diona Fanti -- 12/11/2021--double-J stent placement  Obesity (BMI 30-39.9) BMI 33.89 Lifestyle modification  Hypokalemia Repleted  Type 2 diabetes mellitus with hyperglycemia (HCC) Holding glipizide and metformin Patient takes Basaglar 10  units at bedtime NovoLog sliding scale  UTI (urinary tract infection) UA shows 21-50 WBC -Continue empiric ceftriaxone pending urine culture data  Mixed hyperlipidemia Continue statin  Essential hypertension, benign Holding HCTZ and lisinopril temporarily        Family Communication:   spouse updated at bedside 6/4  Consultants:  urology  Code Status:  FULL   DVT Prophylaxis:  SCDs   Procedures: As Listed in Progress Note Above  Antibiotics: Ceftriaxone 6/3>>   Subjective: Patient still has some burning sensation in her low abdomen.  She denies any fevers, chills, chest pain, shortness breath, cough, hemoptysis.  There is no hematochezia or melena or diarrhea.  No nausea or vomiting.  Objective: Vitals:   12/11/21 0715 12/11/21 0730 12/11/21 0800 12/11/21 0936  BP: 110/72 104/73 104/82 120/79  Pulse: 79 73 68 64  Resp: '16 19 14 16  '$ Temp:    (!) 97.4 F (36.3 C)  TempSrc:    Oral  SpO2: 93% 93% 94% 96%  Weight:      Height:        Intake/Output Summary (Last 24 hours) at 12/11/2021 1014 Last data filed at 12/11/2021 0659 Gross per 24 hour  Intake 1667.56 ml  Output 0 ml  Net 1667.56 ml   Weight change:  Exam:  General:  Pt is alert, follows commands appropriately, not in acute distress HEENT: No icterus, No thrush, No neck mass, [place]/AT Cardiovascular: RRR, S1/S2, no rubs, no gallops Respiratory: CTA bilaterally, no wheezing, no crackles, no rhonchi Abdomen: Soft/+BS, non tender, non distended,  no guarding Extremities: No edema, No lymphangitis, No petechiae, No rashes, no synovitis   Data Reviewed: I have personally reviewed following labs and imaging studies Basic Metabolic Panel: Recent Labs  Lab 12/10/21 2015 12/11/21 0411  NA 137 139  K 3.4* 3.6  CL 105 106  CO2 21* 25  GLUCOSE 218* 144*  BUN 12 13  CREATININE 0.75 0.88  CALCIUM 9.5 8.9  MG  --  1.8  PHOS  --  5.3*   Liver Function Tests: Recent Labs  Lab 12/11/21 0411  AST 22   ALT 37  ALKPHOS 56  BILITOT 0.6  PROT 6.6  ALBUMIN 3.8   No results for input(s): LIPASE, AMYLASE in the last 168 hours. No results for input(s): AMMONIA in the last 168 hours. Coagulation Profile: No results for input(s): INR, PROTIME in the last 168 hours. CBC: Recent Labs  Lab 12/10/21 2015 12/11/21 0411  WBC 16.0* 17.4*  HGB 15.6* 14.6  HCT 45.0 42.6  MCV 85.9 87.3  PLT 308 247   Cardiac Enzymes: No results for input(s): CKTOTAL, CKMB, CKMBINDEX, TROPONINI in the last 168 hours. BNP: Invalid input(s): POCBNP CBG: Recent Labs  Lab 12/11/21 0012 12/11/21 0436 12/11/21 0714 12/11/21 0755  GLUCAP 139* 163* 124* 127*   HbA1C: No results for input(s): HGBA1C in the last 72 hours. Urine analysis:    Component Value Date/Time   COLORURINE AMBER (A) 12/10/2021 2012   APPEARANCEUR CLOUDY (A) 12/10/2021 2012   LABSPEC 1.026 12/10/2021 2012   PHURINE 7.0 12/10/2021 2012   GLUCOSEU NEGATIVE 12/10/2021 2012   HGBUR LARGE (A) 12/10/2021 2012   BILIRUBINUR NEGATIVE 12/10/2021 2012   KETONESUR 20 (A) 12/10/2021 2012   PROTEINUR 100 (A) 12/10/2021 2012   UROBILINOGEN 1.0 03/15/2008 2305   NITRITE NEGATIVE 12/10/2021 2012   LEUKOCYTESUR TRACE (A) 12/10/2021 2012   Sepsis Labs: '@LABRCNTIP'$ (procalcitonin:4,lacticidven:4) ) Recent Results (from the past 240 hour(s))  MRSA Next Gen by PCR, Nasal     Status: None   Collection Time: 12/11/21  2:08 AM   Specimen: Nasal Mucosa; Nasal Swab  Result Value Ref Range Status   MRSA by PCR Next Gen NOT DETECTED NOT DETECTED Final    Comment: (NOTE) The GeneXpert MRSA Assay (FDA approved for NASAL specimens only), is one component of a comprehensive MRSA colonization surveillance program. It is not intended to diagnose MRSA infection nor to guide or monitor treatment for MRSA infections. Test performance is not FDA approved in patients less than 29 years old. Performed at [facility], [address], [place], [place] [address]       Scheduled Meds:  Chlorhexidine Gluconate Cloth  6 each Topical Q0600   insulin aspart  0-15 Units Subcutaneous Q4H   Continuous Infusions:  cefTRIAXone (ROCEPHIN)  IV      Procedures/Studies: CT ABDOMEN PELVIS W CONTRAST  Result Date: 12/10/2021 CLINICAL DATA:  Abdominal pain, acute, nonlocalized. States she was dx with a kidney stone 2-3 weeks ago. Pt also c/o nausea and a fever EXAM: CT ABDOMEN AND PELVIS WITH CONTRAST TECHNIQUE: Multidetector CT imaging of the abdomen and pelvis was performed using the standard protocol following bolus administration of intravenous contrast. RADIATION DOSE REDUCTION: This exam was performed according to the departmental dose-optimization program which includes automated exposure control, adjustment of the mA and/or kV according to patient size and/or use of iterative reconstruction technique. CONTRAST:  142m OMNIPAQUE IOHEXOL 300 MG/ML  SOLN COMPARISON:  None Available. FINDINGS: Lower chest: Left lower lobe linear atelectasis versus scarring.  Hepatobiliary: No focal liver abnormality. No gallstones, gallbladder wall thickening, or pericholecystic fluid. No biliary dilatation. Pancreas: No focal lesion. Normal pancreatic contour. No surrounding inflammatory changes. No main pancreatic ductal dilatation. Spleen: Normal in size without focal abnormality. Splenule is noted (2:26). Adrenals/Urinary Tract: No adrenal nodule bilaterally. Left perinephric fat stranding. Delayed left nephrogram. Urothelial thickening of the left collecting system at the level of the ureteropelvic junction. There is an 8 mm calcified stone at the left ureteropelvic junction with associated proximal mild to moderate hydronephrosis. The left ureter distally is normal in caliber. No right nephroureterolithiasis.  No right hydroureteronephrosis. Subcentimeter hypodensities too small to characterize. The urinary bladder is unremarkable. Stomach/Bowel: Stomach is within normal limits. No  evidence of bowel wall thickening or dilatation. Status post appendectomy. Unremarkable appendiceal stump. Vascular/Lymphatic: No abdominal aorta or iliac aneurysm. No abdominal, pelvic, or inguinal lymphadenopathy. Reproductive: Status post hysterectomy. No adnexal masses. Other: No intraperitoneal free fluid. No intraperitoneal free gas. No organized fluid collection. Musculoskeletal: No abdominal wall hernia or abnormality. No suspicious lytic or blastic osseous lesions. No acute displaced fracture. IMPRESSION: An 8 mm obstructive left ureterovesicular junction stone with findings suggestive of superimposed infection. Correlate with urinalysis. Electronically Signed   By: Iven Finn M.D.   On: 12/10/2021 22:42   DG C-Arm 1-60 Min-No Report  Result Date: 12/11/2021 Fluoroscopy was utilized by the requesting physician.  No radiographic interpretation.    Orson Eva, DO  [facility]  If 7PM-7AM, please contact night-coverage [email or web address] Password [email or web address] 12/11/2021, 10:14 AM   LOS: 1 day    On 7

## 2021-12-11 NOTE — Plan of Care (Signed)

## 2021-12-12 DIAGNOSIS — E669 Obesity, unspecified: Secondary | ICD-10-CM

## 2021-12-12 DIAGNOSIS — I10 Essential (primary) hypertension: Secondary | ICD-10-CM

## 2021-12-12 DIAGNOSIS — E782 Mixed hyperlipidemia: Secondary | ICD-10-CM

## 2021-12-12 DIAGNOSIS — N390 Urinary tract infection, site not specified: Secondary | ICD-10-CM

## 2021-12-12 DIAGNOSIS — N200 Calculus of kidney: Secondary | ICD-10-CM

## 2021-12-12 DIAGNOSIS — E876 Hypokalemia: Secondary | ICD-10-CM

## 2021-12-12 DIAGNOSIS — N3001 Acute cystitis with hematuria: Secondary | ICD-10-CM

## 2021-12-12 LAB — BASIC METABOLIC PANEL
Anion gap: 4 — ABNORMAL LOW (ref 5–15)
BUN: 9 mg/dL (ref 6–20)
CO2: 27 mmol/L (ref 22–32)
Calcium: 8.7 mg/dL — ABNORMAL LOW (ref 8.9–10.3)
Chloride: 110 mmol/L (ref 98–111)
Creatinine, Ser: 0.6 mg/dL (ref 0.44–1.00)
GFR, Estimated: >60 mL/min (ref >60)
Glucose, Bld: 170 mg/dL — ABNORMAL HIGH (ref 70–99)
Potassium: 3.7 mmol/L (ref 3.5–5.1)
Sodium: 141 mmol/L (ref 135–145)

## 2021-12-12 LAB — URINE CULTURE

## 2021-12-12 LAB — CBC
HCT: 41.4 % (ref 36.0–46.0)
Hemoglobin: 13.9 g/dL (ref 12.0–15.0)
MCH: 30 pg (ref 26.0–34.0)
MCHC: 33.6 g/dL (ref 30.0–36.0)
MCV: 89.4 fL (ref 80.0–100.0)
Platelets: 225 10*3/uL (ref 150–400)
RBC: 4.63 MIL/uL (ref 3.87–5.11)
RDW: 12 % (ref 11.5–15.5)
WBC: 8.3 10*3/uL (ref 4.0–10.5)
nRBC: 0 % (ref 0.0–0.2)

## 2021-12-12 LAB — GLUCOSE, CAPILLARY
Glucose-Capillary: 151 mg/dL — ABNORMAL HIGH (ref 70–99)
Glucose-Capillary: 157 mg/dL — ABNORMAL HIGH (ref 70–99)

## 2021-12-12 MED ORDER — OXYCODONE HCL 5 MG PO TABS: 5.0000 mg | ORAL_TABLET | Freq: Four times a day (QID) | ORAL | 0 refills | Status: DC | PRN

## 2021-12-12 MED ORDER — OXYCODONE HCL 5 MG PO TABS: 5.0000 mg | ORAL_TABLET | Freq: Four times a day (QID) | ORAL | Status: DC | PRN

## 2021-12-12 MED ORDER — CEFDINIR 300 MG PO CAPS
300.0000 mg | ORAL_CAPSULE | Freq: Two times a day (BID) | ORAL | Status: DC
  Administered 2021-12-12: 300 mg via ORAL
  Filled 2021-12-12: qty 1

## 2021-12-12 MED ORDER — CEFDINIR 300 MG PO CAPS: 300.0000 mg | ORAL_CAPSULE | Freq: Two times a day (BID) | ORAL | 0 refills | Status: AC

## 2021-12-12 NOTE — Plan of Care (Signed)

## 2021-12-12 NOTE — TOC Progression Note (Signed)
Transition of Care [facility] - Progression Note    Patient Details  Name: ELI ADAMI MRN: [id number] Date of Birth: Feb 10, 1971  Transition of [facility]) CM/SW Contact  Salome Arnt, [place] Phone Number: 12/12/2021, 9:58 AM  Clinical Narrative:   Transition of Care [facility]) Screening Note   Patient Details  Name: TANEIKA CHOI Date of Birth: 12/01/1970   Transition of Care [facility]) CM/SW Contact:    Salome Arnt, [place] Phone Number: 12/12/2021, 9:58 AM    Transition of Care Department [facility]) has reviewed patient and no TOC needs have been identified at this time. We will continue to monitor patient advancement through interdisciplinary progression rounds. If new patient transition needs arise, please place a TOC consult.            Expected Discharge Plan and Services                                                 Social Determinants of Health (SDOH) Interventions    Readmission Risk Interventions     View : No data to display.

## 2021-12-12 NOTE — Progress Notes (Signed)
Upon bedside report, pt began having chills and feeling as if she is spiking a fever. Oral temp of 99.1 was obtained. This RN reached out to [facility] to inform him and request Tylenol PRN order. Tylenol was administered to the patient. Bryson Corona Edd Fabian

## 2021-12-15 ENCOUNTER — Other Ambulatory Visit: Payer: Self-pay

## 2021-12-15 ENCOUNTER — Emergency Department (HOSPITAL_COMMUNITY)
Admission: EM | Admit: 2021-12-15 | Discharge: 2021-12-15 | Disposition: A | Discharge status: Home / Self Care | Payer: BC Managed Care – PPO | Attending: Emergency Medicine | Admitting: Emergency Medicine

## 2021-12-15 ENCOUNTER — Encounter (HOSPITAL_COMMUNITY): Payer: Self-pay

## 2021-12-15 DIAGNOSIS — Z794 Long term (current) use of insulin: Secondary | ICD-10-CM | POA: Diagnosis not present

## 2021-12-15 DIAGNOSIS — H43811 Vitreous degeneration, right eye: Secondary | ICD-10-CM | POA: Diagnosis not present

## 2021-12-15 DIAGNOSIS — H5789 Other specified disorders of eye and adnexa: Secondary | ICD-10-CM | POA: Diagnosis not present

## 2021-12-15 MED ORDER — TETRACAINE HCL 0.5 % OP SOLN
2.0000 [drp] | Freq: Once | OPHTHALMIC | Status: AC
  Administered 2021-12-15: 2 [drp] via OPHTHALMIC
  Filled 2021-12-15: qty 4

## 2021-12-15 MED ORDER — FLUORESCEIN SODIUM 1 MG OP STRP
1.0000 | ORAL_STRIP | Freq: Once | OPHTHALMIC | Status: AC
  Administered 2021-12-15: 1 via OPHTHALMIC
  Filled 2021-12-15: qty 1

## 2021-12-15 NOTE — Discharge Instructions (Addendum)
Go to the ophthalmologist office in the morning and they will see you as a walk-in appointment.

## 2021-12-15 NOTE — ED Provider Notes (Signed)
[facility] EMERGENCY DEPARTMENT Provider Note   CSN: [id number] Arrival date & time: 12/15/21  1938     History  Chief Complaint  Patient presents with   Eye Problem    "Floaters in right eye"    Dana Lynch is a 51 y.o. female.  HPI 51 year old female presents with right eye visual complaints.  These originally started 2 days ago.  She has been dealing with kidney stone infection recently and so she did not pay as much attention.  However 2 days ago she started noticing lightening flashes in her eye twice and then has had floaters since.  She has 2 floaters in her right eye vision.  She is also noted some blurry vision to the right lateral aspect of her visual field though she can still see.  She wears contacts.  She has had a little bit of dull right-sided eye pain but no current headache.  She was reading up on it and was concerned about retinal detachment so she came to the ER.  Home Medications Prior to Admission medications   Medication Sig Start Date End Date Taking? Authorizing Provider  blood glucose meter kit and supplies KIT Dispense based on patient and insurance preference. Use up to four times daily as directed. (FOR ICD-10 E11.65) 03/13/19   Cassandria Anger, MD  Blood Glucose Monitoring Suppl (ONETOUCH VERIO) w/Device KIT 1 each by Does not apply route as needed. 03/12/19   Cassandria Anger, MD  cefdinir (OMNICEF) 300 MG capsule Take 1 capsule (300 mg total) by mouth every 12 (twelve) hours. 12/12/21   Orson Eva, MD  Cholecalciferol 125 MCG (5000 UT) TABS Take by mouth.    [provider]  glimepiride (AMARYL) 2 MG tablet Take 2 mg by mouth 2 (two) times daily. 09/14/21   [provider]  glucose blood (ONETOUCH VERIO) test strip Use as instructed 03/12/19   Cassandria Anger, MD  hydrochlorothiazide (HYDRODIURIL) 25 MG tablet Take 1 tablet (25 mg total) by mouth daily. For postop fluid retention. 05/27/19   Jonnie Kind, MD  Insulin  Glargine [facility] KWIKPEN) 100 UNIT/ML Inject 10 Units into the skin daily.    [provider]  lisinopril (ZESTRIL) 5 MG tablet TAKE 1 TABLET(5 MG) BY MOUTH DAILY Patient taking differently: Take 5 mg by mouth daily. 07/08/19   Estill Dooms, NP  metFORMIN (GLUCOPHAGE-XR) 500 MG 24 hr tablet TAKE 1 TABLET(500 MG) BY MOUTH TWICE DAILY AFTER A MEAL 10/08/19   Nida, Marella Chimes, MD  oxyCODONE (OXY IR/ROXICODONE) 5 MG immediate release tablet Take 1 tablet (5 mg total) by mouth every 6 (six) hours as needed for moderate pain. 12/12/21   Orson Eva, MD  simvastatin (ZOCOR) 20 MG tablet Take 1 tablet (20 mg total) by mouth daily at 6 PM. 05/12/19   Nida, Marella Chimes, MD  TRULICITY 4.5 YQ/6.5HQ SOPN Inject 4.5 mg into the skin once a week. Wednesdays 11/29/21   [provider]      Allergies    Patient has no known allergies.    Review of Systems   Review of Systems  Eyes:  Positive for pain and visual disturbance.  Neurological:  Negative for headaches.    Physical Exam Updated Vital Signs BP 131/84   Pulse 95   Temp 98 F (36.7 C) (Oral)   Resp 18   Ht 5' 8"  (1.727 m)   Wt 101 kg   LMP  (LMP Unknown)   SpO2 100%  BMI 33.86 kg/m  Physical Exam Vitals and nursing note reviewed.  Constitutional:      Appearance: She is well-developed.  HENT:     Head: Normocephalic and atraumatic.     Comments: No tenderness over the temporal artery Eyes:     Extraocular Movements: Extraocular movements intact.     Pupils: Pupils are equal, round, and reactive to light.     Comments: Intact visual fields.  I did a bedside ultrasound and I cannot find an obvious retinal detachment.  Abdominal:     General: There is no distension.  Skin:    General: Skin is warm and dry.  Neurological:     Mental Status: She is alert.     ED Results / Procedures / Treatments   Labs (all labs ordered are listed, but only abnormal results are displayed) Labs Reviewed - No data  to display  EKG None  Radiology No results found.  Procedures Procedures    Medications Ordered in ED Medications  fluorescein ophthalmic strip 1 strip (1 strip Right Eye Given by Other 12/15/21 2218)  tetracaine (PONTOCAINE) 0.5 % ophthalmic solution 2 drop (2 drops Right Eye Given by Other 12/15/21 2218)    ED Course/ Medical Decision Making/ A&P                           Medical Decision Making Risk Prescription drug management.   I discussed case with Dr. Stacie Glaze of ophthalmology.  She suspects it is likely a vitreous detachment but will need to see her in the morning to rule out retinal detachment.  My bedside ultrasound did not show an obvious retinal detachment.  I did do bedside Tono-Pen and her pressure is around 18.  No corneal abrasions on exam.  No temporal tenderness and so I do not suspect temporal arteritis.  Otherwise, appears stable for discharge to follow-up with the ophthalmologist first thing in the morning.        Final Clinical Impression(s) / ED Diagnoses Final diagnoses:  Vitreous detachment of right eye    Rx / DC Orders ED Discharge Orders     None         Sherwood Gambler, MD 12/15/21 [id number]

## 2021-12-15 NOTE — ED Triage Notes (Signed)
Pt presents to ED with c/o "floaters" to right eye since Tuesday, pt does report slight pain to lateral aspect of right eye. Pt does not report any other symptoms.

## 2021-12-16 ENCOUNTER — Encounter (HOSPITAL_COMMUNITY): Payer: Self-pay | Admitting: Urology

## 2021-12-16 DIAGNOSIS — H43813 Vitreous degeneration, bilateral: Secondary | ICD-10-CM | POA: Diagnosis not present

## 2021-12-22 ENCOUNTER — Ambulatory Visit: Payer: BC Managed Care – PPO | Admitting: Urology

## 2021-12-26 ENCOUNTER — Other Ambulatory Visit: Payer: Self-pay

## 2021-12-26 ENCOUNTER — Ambulatory Visit (INDEPENDENT_AMBULATORY_CARE_PROVIDER_SITE_OTHER): Payer: BC Managed Care – PPO | Admitting: Urology

## 2021-12-26 ENCOUNTER — Encounter: Payer: Self-pay | Admitting: Urology

## 2021-12-26 VITALS — BP 133/85 | HR 112 | Ht 68.0 in | Wt 223.0 lb

## 2021-12-26 DIAGNOSIS — N201 Calculus of ureter: Secondary | ICD-10-CM | POA: Diagnosis not present

## 2021-12-26 LAB — URINALYSIS, ROUTINE W REFLEX MICROSCOPIC
Bilirubin, UA: NEGATIVE
Ketones, UA: NEGATIVE
Leukocytes,UA: NEGATIVE
Nitrite, UA: NEGATIVE
Specific Gravity, UA: 1.015 (ref 1.005–1.030)
Urobilinogen, Ur: 0.2 mg/dL (ref 0.2–1.0)
pH, UA: 5.5 (ref 5.0–7.5)

## 2021-12-26 LAB — MICROSCOPIC EXAMINATION
Bacteria, UA: NONE SEEN
Epithelial Cells (non renal): NONE SEEN /hpf (ref 0–10)
RBC, Urine: >30 /hpf — AB (ref 0–2)
WBC, UA: NONE SEEN /hpf (ref 0–5)

## 2021-12-26 MED ORDER — NITROFURANTOIN MONOHYD MACRO 100 MG PO CAPS: 100.0000 mg | ORAL_CAPSULE | Freq: Every day | ORAL | 0 refills | Status: AC

## 2021-12-26 NOTE — Progress Notes (Signed)
I spoke with Ms. Dines. We have discussed possible surgery dates and 01/05/2022 was agreed upon by all parties. Patient given information about surgery date, what to expect pre-operatively and post operatively.    We discussed that a pre-op nurse will be calling to set up the pre-op visit that will take place prior to surgery. Informed patient that our office will communicate any additional care to be provided after surgery.    Patients questions or concerns were discussed during our call. Advised to call our office should there be any additional information, questions or concerns that arise. Patient verbalized understanding.

## 2021-12-26 NOTE — Progress Notes (Signed)
 12/26/2021 1:35 PM   Dana Lynch 02/02/1971 [id number]  Referring provider: Bakare, Mobolaji B, MD [address] [place],  [place] [address]  No chief complaint on file.   HPI:  New patient-  1) left ureteral stone-she was urgently stented 12/11/2021 for an 8 mm left proximal stones and concerns for infection.  Urine culture grew multiple species.  Her creatinine was 0.75.  There were no other stones.  The stone may have been visible on the scout.  No prior kidney stones. She has a h/o recurrent UTI - symptoms of bladder pain and dark urine. Abx helped.   Today, seen for the above. She is not on abx.   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   PMH: Past Medical History:  Diagnosis Date   Bowel habit changes 12/22/2013   Alternates constipation and diarrhea. Will refer to GI    Diabetes (HCC) 02/16/2014   Fibroids 12/22/2013   Hypertension    Kidney stone    PONV (postoperative nausea and vomiting)    Unspecified symptom associated with female genital organs 12/22/2013    Surgical History: Past Surgical History:  Procedure Laterality Date   ABDOMINAL HYSTERECTOMY N/A 05/20/2019   Procedure: SUPRACERVICAL ABDOMINAL HYSTERECTOMY WITH LEFT SALPINGECTOMY;  Surgeon: Ferguson, John V, MD;  Location: [facility];  Service: Gynecology;  Laterality: N/A;   APPENDECTOMY     BREAST BIOPSY Left    CYSTOSCOPY WITH STENT PLACEMENT Left 12/11/2021   Procedure: CYSTOSCOPY WITH RETROGRADE PYLEOGRAM WITH LEFT  STENT PLACEMENT;  Surgeon: Dahlstedt, Stephen, MD;  Location: [facility];  Service: Urology;  Laterality: Left;   ENDOMETRIAL ABLATION     TONSILLECTOMY AND ADENOIDECTOMY      Home Medications:  Allergies as of 12/26/2021   No Known Allergies      Medication List        Accurate as of December 26, 2021  1:35 PM. If you have any questions, ask your nurse or doctor.          Basaglar KwikPen 100 UNIT/ML Inject 10 Units into the skin daily.   blood glucose meter kit and  supplies Kit Dispense based on patient and insurance preference. Use up to four times daily as directed. (FOR ICD-10 E11.65)   cefdinir 300 MG capsule Commonly known as: OMNICEF Take 1 capsule (300 mg total) by mouth every 12 (twelve) hours.   Cholecalciferol 125 MCG (5000 UT) Tabs Take by mouth.   glimepiride 2 MG tablet Commonly known as: AMARYL Take 2 mg by mouth 2 (two) times daily.   hydrochlorothiazide 25 MG tablet Commonly known as: HYDRODIURIL Take 1 tablet (25 mg total) by mouth daily. For postop fluid retention.   lisinopril 5 MG tablet Commonly known as: ZESTRIL TAKE 1 TABLET(5 MG) BY MOUTH DAILY   metFORMIN 500 MG 24 hr tablet Commonly known as: GLUCOPHAGE-XR TAKE 1 TABLET(500 MG) BY MOUTH TWICE DAILY AFTER A MEAL   OneTouch Verio test strip Generic drug: glucose blood Use as instructed   OneTouch Verio w/Device Kit 1 each by Does not apply route as needed.   oxyCODONE 5 MG immediate release tablet Commonly known as: Oxy IR/ROXICODONE Take 1 tablet (5 mg total) by mouth every 6 (six) hours as needed for moderate pain.   simvastatin 20 MG tablet Commonly known as: ZOCOR Take 1 tablet (20 mg total) by mouth daily at 6 PM.   Trulicity 4.5 MG/0.5ML Sopn Generic drug: Dulaglutide Inject 4.5 mg into the skin once a week. Wednesdays          Allergies: No Known Allergies  Family History: Family History  Problem Relation Age of Onset   Diabetes Mother    Diabetes Maternal Grandmother    Hypertension Maternal Grandmother    Cancer Maternal Grandfather        prostate   Heart disease Paternal Grandmother    Diabetes Paternal Grandmother    Heart disease Paternal Grandfather    Breast cancer Maternal Aunt     Social History:  reports that she has never smoked. She has never used smokeless tobacco. She reports current alcohol use. She reports that she does not use drugs.   Physical Exam: BP 133/85   Pulse (!) 112   Ht 5' 8" (1.727 m)   Wt 223 lb  (101.2 kg)   LMP  (LMP Unknown)   BMI 33.91 kg/m   Constitutional:  Alert and oriented, No acute distress. HEENT: [place] AT, moist mucus membranes.  Trachea midline, no masses. Cardiovascular: No clubbing, cyanosis, or edema. Respiratory: Normal respiratory effort, no increased work of breathing. GI: Abdomen is soft, nontender, nondistended, no abdominal masses GU: No CVA tenderness Skin: No rashes, bruises or suspicious lesions. Neurologic: Grossly intact, no focal deficits, moving all 4 extremities. Psychiatric: Normal mood and affect.  Laboratory Data: Lab Results  Component Value Date   WBC 8.3 12/12/2021   HGB 13.9 12/12/2021   HCT 41.4 12/12/2021   MCV 89.4 12/12/2021   PLT 225 12/12/2021    Lab Results  Component Value Date   CREATININE 0.60 12/12/2021    No results found for: "PSA"  No results found for: "TESTOSTERONE"  Lab Results  Component Value Date   HGBA1C 7.2 (H) 12/10/2021    Urinalysis    Component Value Date/Time   COLORURINE AMBER (A) 12/10/2021 2012   APPEARANCEUR CLOUDY (A) 12/10/2021 2012   LABSPEC 1.026 12/10/2021 2012   PHURINE 7.0 12/10/2021 2012   GLUCOSEU NEGATIVE 12/10/2021 2012   HGBUR LARGE (A) 12/10/2021 2012   BILIRUBINUR NEGATIVE 12/10/2021 2012   KETONESUR 20 (A) 12/10/2021 2012   PROTEINUR 100 (A) 12/10/2021 2012   UROBILINOGEN 1.0 03/15/2008 2305   NITRITE NEGATIVE 12/10/2021 2012   LEUKOCYTESUR TRACE (A) 12/10/2021 2012    Lab Results  Component Value Date   LABMICR 3.2 01/19/2017   BACTERIA NONE SEEN 12/10/2021    Pertinent Imaging: CT    Assessment & Plan:    Left ureteral stone - discussed nature r/b of stent pull, left ESWL then stent pull or cystoscopy, left RGP, left URS - HLL. She has heard "horrible things" about ESWL and wants to proceed with left URS.   Urine sent for cx. Start a nightly NF while stent is in place. Discussed I might do the procedure or one of my colleagues.  - Urinalysis, Routine w reflex  microscopic   No follow-ups on file.  [name], MD  [facility]  [address] [place], [place] [address] [phone]   

## 2021-12-26 NOTE — H&P (View-Only) (Signed)
12/26/2021 1:35 PM   Dana Lynch September 12, 1970 [id number]  Referring provider: Audley Hose, MD [address] [place],  [place] [address]  No chief complaint on file.   HPI:  New patient-  1) left ureteral stone-she was urgently stented 12/11/2021 for an 8 mm left proximal stones and concerns for infection.  Urine culture grew multiple species.  Her creatinine was 0.75.  There were no other stones.  The stone may have been visible on the scout.  No prior kidney stones. She has a h/o recurrent UTI - symptoms of bladder pain and dark urine. Abx helped.   Today, seen for the above. She is not on abx.   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   PMH: Past Medical History:  Diagnosis Date   Bowel habit changes 12/22/2013   Alternates constipation and diarrhea. Will refer to GI    Diabetes ([place]) 02/16/2014   Fibroids 12/22/2013   Hypertension    Kidney stone    PONV (postoperative nausea and vomiting)    Unspecified symptom associated with female genital organs 12/22/2013    Surgical History: Past Surgical History:  Procedure Laterality Date   ABDOMINAL HYSTERECTOMY N/A 05/20/2019   Procedure: SUPRACERVICAL ABDOMINAL HYSTERECTOMY WITH LEFT SALPINGECTOMY;  Surgeon: Jonnie Kind, MD;  Location: [facility];  Service: Gynecology;  Laterality: N/A;   APPENDECTOMY     BREAST BIOPSY Left    CYSTOSCOPY WITH STENT PLACEMENT Left 12/11/2021   Procedure: CYSTOSCOPY WITH RETROGRADE PYLEOGRAM WITH LEFT  STENT PLACEMENT;  Surgeon: Franchot Gallo, MD;  Location: [facility];  Service: Urology;  Laterality: Left;   ENDOMETRIAL ABLATION     TONSILLECTOMY AND ADENOIDECTOMY      Home Medications:  Allergies as of 12/26/2021   No Known Allergies      Medication List        Accurate as of December 26, 2021  1:35 PM. If you have any questions, ask your nurse or doctor.          Basaglar KwikPen 100 UNIT/ML Inject 10 Units into the skin daily.   blood glucose meter kit and  supplies Kit Dispense based on patient and insurance preference. Use up to four times daily as directed. (FOR ICD-10 E11.65)   cefdinir 300 MG capsule Commonly known as: OMNICEF Take 1 capsule (300 mg total) by mouth every 12 (twelve) hours.   Cholecalciferol 125 MCG (5000 UT) Tabs Take by mouth.   glimepiride 2 MG tablet Commonly known as: AMARYL Take 2 mg by mouth 2 (two) times daily.   hydrochlorothiazide 25 MG tablet Commonly known as: HYDRODIURIL Take 1 tablet (25 mg total) by mouth daily. For postop fluid retention.   lisinopril 5 MG tablet Commonly known as: ZESTRIL TAKE 1 TABLET(5 MG) BY MOUTH DAILY   metFORMIN 500 MG 24 hr tablet Commonly known as: GLUCOPHAGE-XR TAKE 1 TABLET(500 MG) BY MOUTH TWICE DAILY AFTER A MEAL   OneTouch Verio test strip Generic drug: glucose blood Use as instructed   OneTouch Verio w/Device Kit 1 each by Does not apply route as needed.   oxyCODONE 5 MG immediate release tablet Commonly known as: Oxy IR/ROXICODONE Take 1 tablet (5 mg total) by mouth every 6 (six) hours as needed for moderate pain.   simvastatin 20 MG tablet Commonly known as: ZOCOR Take 1 tablet (20 mg total) by mouth daily at 6 PM.   Trulicity 4.5 GL/8.7FI Sopn Generic drug: Dulaglutide Inject 4.5 mg into the skin once a week. Wednesdays  Allergies: No Known Allergies  Family History: Family History  Problem Relation Age of Onset   Diabetes Mother    Diabetes Maternal Grandmother    Hypertension Maternal Grandmother    Cancer Maternal Grandfather        prostate   Heart disease Paternal Grandmother    Diabetes Paternal Grandmother    Heart disease Paternal Grandfather    Breast cancer Maternal Aunt     Social History:  reports that she has never smoked. She has never used smokeless tobacco. She reports current alcohol use. She reports that she does not use drugs.   Physical Exam: BP 133/85   Pulse (!) 112   Ht 5' 8" (1.727 m)   Wt 223 lb  (101.2 kg)   LMP  (LMP Unknown)   BMI 33.91 kg/m   Constitutional:  Alert and oriented, No acute distress. HEENT: [place] AT, moist mucus membranes.  Trachea midline, no masses. Cardiovascular: No clubbing, cyanosis, or edema. Respiratory: Normal respiratory effort, no increased work of breathing. GI: Abdomen is soft, nontender, nondistended, no abdominal masses GU: No CVA tenderness Skin: No rashes, bruises or suspicious lesions. Neurologic: Grossly intact, no focal deficits, moving all 4 extremities. Psychiatric: Normal mood and affect.  Laboratory Data: Lab Results  Component Value Date   WBC 8.3 12/12/2021   HGB 13.9 12/12/2021   HCT 41.4 12/12/2021   MCV 89.4 12/12/2021   PLT 225 12/12/2021    Lab Results  Component Value Date   CREATININE 0.60 12/12/2021    No results found for: "PSA"  No results found for: "TESTOSTERONE"  Lab Results  Component Value Date   HGBA1C 7.2 (H) 12/10/2021    Urinalysis    Component Value Date/Time   COLORURINE AMBER (A) 12/10/2021 2012   APPEARANCEUR CLOUDY (A) 12/10/2021 2012   LABSPEC 1.026 12/10/2021 2012   PHURINE 7.0 12/10/2021 2012   GLUCOSEU NEGATIVE 12/10/2021 2012   HGBUR LARGE (A) 12/10/2021 2012   BILIRUBINUR NEGATIVE 12/10/2021 2012   KETONESUR 20 (A) 12/10/2021 2012   PROTEINUR 100 (A) 12/10/2021 2012   UROBILINOGEN 1.0 03/15/2008 2305   NITRITE NEGATIVE 12/10/2021 2012   LEUKOCYTESUR TRACE (A) 12/10/2021 2012    Lab Results  Component Value Date   LABMICR 3.2 01/19/2017   BACTERIA NONE SEEN 12/10/2021    Pertinent Imaging: CT    Assessment & Plan:    Left ureteral stone - discussed nature r/b of stent pull, left ESWL then stent pull or cystoscopy, left RGP, left URS - HLL. She has heard "horrible things" about ESWL and wants to proceed with left URS.   Urine sent for cx. Start a nightly NF while stent is in place. Discussed I might do the procedure or one of my colleagues.  - Urinalysis, Routine w reflex  microscopic   No follow-ups on file.  Festus Aloe, MD  [facility]  [address] [place], [place] [address] [phone]

## 2021-12-29 NOTE — Patient Instructions (Signed)
Dana Lynch  12/29/2021     '@PREFPERIOPPHARMACY'$ @   Your procedure is scheduled on  01/05/2022.   Report to [name] at  1200  P.M.   Call this number if you have problems the morning of surgery:  [phone]   Remember:  Do not eat or drink after midnight.      Your last dose of trulicity should have been 6/21 or before.    DO NOT take any medications for diabetes the morning of your procedure.    Take these medicines the morning of surgery with A SIP OF WATER                                        None.     Do not wear jewelry, make-up or nail polish.  Do not wear lotions, powders, or perfumes, or deodorant.  Do not shave 48 hours prior to surgery.  Men may shave face and neck.  Do not bring valuables to the hospital.  [facility] is not responsible for any belongings or valuables.  Contacts, dentures or bridgework may not be worn into surgery.  Leave your suitcase in the car.  After surgery it may be brought to your room.  For patients admitted to the hospital, discharge time will be determined by your treatment team.  Patients discharged the day of surgery will not be allowed to drive home and must have someone with them for 24 hours.    Special instructions:   DO NOT smoke tobacco or vape for 24 hours before your procedure.  Please read over the following fact sheets that you were given. Anesthesia Post-op Instructions and Care and Recovery After Surgery      Ureteral Stent Implantation, Care After This sheet gives you information about how to care for yourself after your procedure. Your health care provider may also give you more specific instructions. If you have problems or questions, contact your health care provider. What can I expect after the procedure? After the procedure, it is common to have: Nausea. Mild pain when you urinate. You may feel this pain in your lower back or lower abdomen. The pain should stop within a few minutes  after you urinate. This may last for up to 1 week. A small amount of blood in your urine for several days. Follow these instructions at home: Medicines Take over-the-counter and prescription medicines only as told by your health care provider. If you were prescribed an antibiotic medicine, take it as told by your health care provider. Do not stop taking the antibiotic even if you start to feel better. Do not drive for 24 hours if you were given a sedative during your procedure. Ask your health care provider if the medicine prescribed to you requires you to avoid driving or using heavy machinery. Activity Rest as told by your health care provider. Avoid sitting for a long time without moving. Get up to take short walks every 1-2 hours. This is important to improve blood flow and breathing. Ask for help if you feel weak or unsteady. Return to your normal activities as told by your health care provider. Ask your health care provider what activities are safe for you. General instructions  Watch for any blood in your urine. Call your health care provider if the amount of blood in your urine increases. If you have a  catheter: Follow instructions from your health care provider about taking care of your catheter and collection bag. Do not take baths, swim, or use a hot tub until your health care provider approves. Ask your health care provider if you may take showers. You may only be allowed to take sponge baths. Drink enough fluid to keep your urine pale yellow. Do not use any products that contain nicotine or tobacco, such as cigarettes, e-cigarettes, and chewing tobacco. These can delay healing after surgery. If you need help quitting, ask your health care provider. Keep all follow-up visits as told by your health care provider. This is important. Contact a health care provider if: You have pain that gets worse or does not get better with medicine, especially pain when you urinate. You have  difficulty urinating. You feel nauseous or you vomit repeatedly during a period of more than 2 days after the procedure. Get help right away if: Your urine is dark red or has blood clots in it. You are leaking urine (have incontinence). The end of the stent comes out of your urethra. You cannot urinate. You have sudden, sharp, or severe pain in your abdomen or lower back. You have a fever. You have swelling or pain in your legs. You have difficulty breathing. Summary After the procedure, it is common to have mild pain when you urinate that goes away within a few minutes after you urinate. This may last for up to 1 week. Watch for any blood in your urine. Call your health care provider if the amount of blood in your urine increases. Take over-the-counter and prescription medicines only as told by your health care provider. Drink enough fluid to keep your urine pale yellow. This information is not intended to replace advice given to you by your health care provider. Make sure you discuss any questions you have with your health care provider. Document Revised: 05/23/2021 Document Reviewed: 05/02/2021 Elsevier Patient Education  2022 [place] Anesthesia, Adult, Care After This sheet gives you information about how to care for yourself after your procedure. Your health care provider may also give you more specific instructions. If you have problems or questions, contact your health care provider. What can I expect after the procedure? After the procedure, the following side effects are common: Pain or discomfort at the IV site. Nausea. Vomiting. Sore throat. Trouble concentrating. Feeling cold or chills. Feeling weak or tired. Sleepiness and fatigue. Soreness and body aches. These side effects can affect parts of the body that were not involved in surgery. Follow these instructions at home: For the time period you were told by your health care provider:  Rest. Do not  participate in activities where you could fall or become injured. Do not drive or use machinery. Do not drink alcohol. Do not take sleeping pills or medicines that cause drowsiness. Do not make important decisions or sign legal documents. Do not take care of children on your own. Eating and drinking Follow any instructions from your health care provider about eating or drinking restrictions. When you feel hungry, start by eating small amounts of foods that are soft and easy to digest (bland), such as toast. Gradually return to your regular diet. Drink enough fluid to keep your urine pale yellow. If you vomit, rehydrate by drinking water, juice, or clear broth. General instructions If you have sleep apnea, surgery and certain medicines can increase your risk for breathing problems. Follow instructions from your health care provider about wearing your sleep device:  Anytime you are sleeping, including during daytime naps. While taking prescription pain medicines, sleeping medicines, or medicines that make you drowsy. Have a responsible adult stay with you for the time you are told. It is important to have someone help care for you until you are awake and alert. Return to your normal activities as told by your health care provider. Ask your health care provider what activities are safe for you. Take over-the-counter and prescription medicines only as told by your health care provider. If you smoke, do not smoke without supervision. Keep all follow-up visits as told by your health care provider. This is important. Contact a health care provider if: You have nausea or vomiting that does not get better with medicine. You cannot eat or drink without vomiting. You have pain that does not get better with medicine. You are unable to pass urine. You develop a skin rash. You have a fever. You have redness around your IV site that gets worse. Get help right away if: You have difficulty breathing. You  have chest pain. You have blood in your urine or stool, or you vomit blood. Summary After the procedure, it is common to have a sore throat or nausea. It is also common to feel tired. Have a responsible adult stay with you for the time you are told. It is important to have someone help care for you until you are awake and alert. When you feel hungry, start by eating small amounts of foods that are soft and easy to digest (bland), such as toast. Gradually return to your regular diet. Drink enough fluid to keep your urine pale yellow. Return to your normal activities as told by your health care provider. Ask your health care provider what activities are safe for you. This information is not intended to replace advice given to you by your health care provider. Make sure you discuss any questions you have with your health care provider. Document Revised: 03/11/2020 Document Reviewed: 10/09/2019 Elsevier Patient Education  [place].

## 2021-12-30 ENCOUNTER — Ambulatory Visit: Payer: BC Managed Care – PPO | Admitting: Urology

## 2022-01-02 ENCOUNTER — Encounter (HOSPITAL_COMMUNITY): Payer: Self-pay | Admitting: Urology

## 2022-01-03 ENCOUNTER — Encounter (HOSPITAL_COMMUNITY)
Admission: RE | Admit: 2022-01-03 | Discharge: 2022-01-03 | Disposition: A | Discharge status: Home / Self Care | Payer: BC Managed Care – PPO | Source: Ambulatory Visit | Attending: Urology | Admitting: Urology

## 2022-01-05 ENCOUNTER — Ambulatory Visit (HOSPITAL_COMMUNITY): Payer: BC Managed Care – PPO | Admitting: Anesthesiology

## 2022-01-05 ENCOUNTER — Encounter (HOSPITAL_COMMUNITY)
Admission: RE | Disposition: A | Discharge status: Home / Self Care | Payer: Self-pay | Source: Home / Self Care | Attending: Urology

## 2022-01-05 ENCOUNTER — Encounter (HOSPITAL_COMMUNITY): Payer: Self-pay | Admitting: Urology

## 2022-01-05 ENCOUNTER — Ambulatory Visit (HOSPITAL_COMMUNITY): Payer: BC Managed Care – PPO

## 2022-01-05 ENCOUNTER — Ambulatory Visit (HOSPITAL_COMMUNITY)
Admission: RE | Admit: 2022-01-05 | Discharge: 2022-01-05 | Disposition: A | Discharge status: Home / Self Care | Payer: BC Managed Care – PPO | Attending: Urology | Admitting: Urology

## 2022-01-05 DIAGNOSIS — Z87440 Personal history of urinary (tract) infections: Secondary | ICD-10-CM | POA: Insufficient documentation

## 2022-01-05 DIAGNOSIS — I10 Essential (primary) hypertension: Secondary | ICD-10-CM | POA: Insufficient documentation

## 2022-01-05 DIAGNOSIS — E119 Type 2 diabetes mellitus without complications: Secondary | ICD-10-CM | POA: Insufficient documentation

## 2022-01-05 DIAGNOSIS — N201 Calculus of ureter: Secondary | ICD-10-CM | POA: Insufficient documentation

## 2022-01-05 DIAGNOSIS — Z7984 Long term (current) use of oral hypoglycemic drugs: Secondary | ICD-10-CM | POA: Insufficient documentation

## 2022-01-05 DIAGNOSIS — E1165 Type 2 diabetes mellitus with hyperglycemia: Secondary | ICD-10-CM

## 2022-01-05 HISTORY — PX: STONE EXTRACTION WITH BASKET: SHX5318

## 2022-01-05 HISTORY — PX: HOLMIUM LASER APPLICATION: SHX5852

## 2022-01-05 HISTORY — PX: CYSTOSCOPY WITH RETROGRADE PYELOGRAM, URETEROSCOPY AND STENT PLACEMENT: SHX5789

## 2022-01-05 LAB — GLUCOSE, CAPILLARY
Glucose-Capillary: 256 mg/dL — ABNORMAL HIGH (ref 70–99)
Glucose-Capillary: 185 mg/dL — ABNORMAL HIGH (ref 70–99)

## 2022-01-05 SURGERY — CYSTOURETEROSCOPY, WITH RETROGRADE PYELOGRAM AND STENT INSERTION
Anesthesia: General | Site: Ureter | Laterality: Left

## 2022-01-05 MED ORDER — DEXMEDETOMIDINE HCL IN NACL 200 MCG/50ML IV SOLN
INTRAVENOUS | Status: DC | PRN
  Administered 2022-01-05: 12 ug via INTRAVENOUS

## 2022-01-05 MED ORDER — HYDROMORPHONE HCL 1 MG/ML IJ SOLN
INTRAMUSCULAR | Status: DC | PRN
  Administered 2022-01-05 (×2): .5 mg via INTRAVENOUS

## 2022-01-05 MED ORDER — DEXAMETHASONE SODIUM PHOSPHATE 4 MG/ML IJ SOLN
INTRAMUSCULAR | Status: DC | PRN
  Administered 2022-01-05: 5 mg via INTRAVENOUS

## 2022-01-05 MED ORDER — ETOMIDATE 2 MG/ML IV SOLN
INTRAVENOUS | Status: AC
  Filled 2022-01-05: qty 10

## 2022-01-05 MED ORDER — HYDROMORPHONE HCL 1 MG/ML IJ SOLN
INTRAMUSCULAR | Status: AC
  Filled 2022-01-05: qty 1

## 2022-01-05 MED ORDER — SODIUM CHLORIDE 0.9 % IR SOLN
Status: DC | PRN
  Administered 2022-01-05 (×2): 3000 mL

## 2022-01-05 MED ORDER — ONDANSETRON HCL 4 MG/2ML IJ SOLN
INTRAMUSCULAR | Status: DC | PRN
  Administered 2022-01-05: 4 mg via INTRAVENOUS

## 2022-01-05 MED ORDER — KETOROLAC TROMETHAMINE 30 MG/ML IJ SOLN
INTRAMUSCULAR | Status: AC
  Filled 2022-01-05: qty 1

## 2022-01-05 MED ORDER — LIDOCAINE HCL (CARDIAC) PF 100 MG/5ML IV SOSY
PREFILLED_SYRINGE | INTRAVENOUS | Status: DC | PRN
  Administered 2022-01-05: 100 mg via INTRAVENOUS

## 2022-01-05 MED ORDER — DIATRIZOATE MEGLUMINE 30 % UR SOLN
URETHRAL | Status: AC
  Filled 2022-01-05: qty 100

## 2022-01-05 MED ORDER — METOCLOPRAMIDE HCL 5 MG/ML IJ SOLN
INTRAMUSCULAR | Status: AC
  Filled 2022-01-05: qty 2

## 2022-01-05 MED ORDER — CEFAZOLIN SODIUM-DEXTROSE 2-4 GM/100ML-% IV SOLN
2.0000 g | INTRAVENOUS | Status: AC
  Administered 2022-01-05: 2 g via INTRAVENOUS
  Filled 2022-01-05: qty 100

## 2022-01-05 MED ORDER — FENTANYL CITRATE (PF) 100 MCG/2ML IJ SOLN
INTRAMUSCULAR | Status: AC
  Filled 2022-01-05: qty 2

## 2022-01-05 MED ORDER — DIATRIZOATE MEGLUMINE 30 % UR SOLN
URETHRAL | Status: DC | PRN
  Administered 2022-01-05: 8 mL via URETHRAL

## 2022-01-05 MED ORDER — DEXMEDETOMIDINE HCL IN NACL 80 MCG/20ML IV SOLN
INTRAVENOUS | Status: AC
  Filled 2022-01-05: qty 20

## 2022-01-05 MED ORDER — ORAL CARE MOUTH RINSE: 15.0000 mL | Freq: Once | OROMUCOSAL | Status: AC

## 2022-01-05 MED ORDER — WATER FOR IRRIGATION, STERILE IR SOLN
Status: DC | PRN
  Administered 2022-01-05: 500 mL

## 2022-01-05 MED ORDER — PROPOFOL 10 MG/ML IV BOLUS
INTRAVENOUS | Status: DC | PRN
  Administered 2022-01-05: 200 mg via INTRAVENOUS

## 2022-01-05 MED ORDER — CHLORHEXIDINE GLUCONATE 0.12 % MT SOLN
15.0000 mL | Freq: Once | OROMUCOSAL | Status: AC
  Administered 2022-01-05: 15 mL via OROMUCOSAL

## 2022-01-05 MED ORDER — FENTANYL CITRATE PF 50 MCG/ML IJ SOSY: 25.0000 ug | PREFILLED_SYRINGE | INTRAMUSCULAR | Status: DC | PRN

## 2022-01-05 MED ORDER — FENTANYL CITRATE (PF) 100 MCG/2ML IJ SOLN
INTRAMUSCULAR | Status: DC | PRN
  Administered 2022-01-05: 50 ug via INTRAVENOUS
  Administered 2022-01-05 (×2): 25 ug via INTRAVENOUS

## 2022-01-05 MED ORDER — KETOROLAC TROMETHAMINE 30 MG/ML IJ SOLN
INTRAMUSCULAR | Status: DC | PRN
  Administered 2022-01-05: 15 mg via INTRAVENOUS

## 2022-01-05 MED ORDER — CHLORHEXIDINE GLUCONATE 0.12 % MT SOLN
OROMUCOSAL | Status: DC
Start: 2022-01-05 — End: 2022-01-05
  Filled 2022-01-05: qty 15

## 2022-01-05 MED ORDER — ONDANSETRON HCL 4 MG/2ML IJ SOLN: 4.0000 mg | Freq: Once | INTRAMUSCULAR | Status: DC | PRN

## 2022-01-05 MED ORDER — OXYCODONE HCL 5 MG PO TABS: 5.0000 mg | ORAL_TABLET | Freq: Four times a day (QID) | ORAL | 0 refills | Status: AC | PRN

## 2022-01-05 MED ORDER — LACTATED RINGERS IV SOLN: INTRAVENOUS | Status: DC

## 2022-01-05 MED ORDER — ONDANSETRON HCL 4 MG PO TABS: 4.0000 mg | ORAL_TABLET | Freq: Every day | ORAL | 1 refills | Status: AC | PRN

## 2022-01-05 SURGICAL SUPPLY — 27 items
BAG DRAIN URO TABLE W/ADPT NS (BAG) ×2 IMPLANT
BAG DRN 8 ADPR NS SKTRN CSTL (BAG) ×1
BAG HAMPER (MISCELLANEOUS) ×2 IMPLANT
CATH INTERMIT  6FR 70CM (CATHETERS) ×2 IMPLANT
CLOTH BEACON ORANGE TIMEOUT ST (SAFETY) ×2 IMPLANT
EXTRACTOR STONE NITINOL NGAGE (UROLOGICAL SUPPLIES) ×1 IMPLANT
GLOVE BIO SURGEON STRL SZ8 (GLOVE) ×2 IMPLANT
GLOVE BIOGEL PI IND STRL 7.0 (GLOVE) ×2 IMPLANT
GLOVE BIOGEL PI INDICATOR 7.0 (GLOVE) ×3
GLOVE ECLIPSE 6.5 STRL STRAW (GLOVE) ×1 IMPLANT
GOWN STRL REUS W/TWL LRG LVL3 (GOWN DISPOSABLE) ×3 IMPLANT
GOWN STRL REUS W/TWL XL LVL3 (GOWN DISPOSABLE) ×2 IMPLANT
GUIDEWIRE STR DUAL SENSOR (WIRE) ×2 IMPLANT
GUIDEWIRE STR ZIPWIRE 035X150 (MISCELLANEOUS) ×2 IMPLANT
IV NS IRRIG 3000ML ARTHROMATIC (IV SOLUTION) ×4 IMPLANT
KIT TURNOVER CYSTO (KITS) ×2 IMPLANT
MANIFOLD NEPTUNE II (INSTRUMENTS) ×2 IMPLANT
PACK CYSTO (CUSTOM PROCEDURE TRAY) ×2 IMPLANT
PAD ARMBOARD 7.5X6 YLW CONV (MISCELLANEOUS) ×2 IMPLANT
SHEATH URETERAL 12FRX35CM (MISCELLANEOUS) ×1 IMPLANT
STENT URET 6FRX26 CONTOUR (STENTS) ×1 IMPLANT
SYR 10ML LL (SYRINGE) ×2 IMPLANT
SYR CONTROL 10ML LL (SYRINGE) ×2 IMPLANT
TOWEL OR 17X26 4PK STRL BLUE (TOWEL DISPOSABLE) ×2 IMPLANT
TRACTIP FLEXIVA PULS ID 200XHI (Laser) IMPLANT
TRACTIP FLEXIVA PULSE ID 200 (Laser) ×2
WATER STERILE IRR 500ML POUR (IV SOLUTION) ×2 IMPLANT

## 2022-01-05 NOTE — Transfer of Care (Signed)
Immediate Anesthesia Transfer of Care Note  Patient: Dana Lynch  Procedure(s) Performed: CYSTOSCOPY WITH RETROGRADE PYELOGRAM, URETEROSCOPY AND STENT EXCHANGE (Left: Ureter) HOLMIUM LASER APPLICATION (Left: Ureter) STONE EXTRACTION WITH BASKET (Left: Ureter)  Patient Location: PACU  Anesthesia Type:General  Level of Consciousness: sedated  Airway & Oxygen Therapy: Patient Spontanous Breathing  Post-op Assessment: Report given to RN, Post -op Vital signs reviewed and stable and Patient moving all extremities X 4  Post vital signs: Reviewed and stable  Last Vitals:  Vitals Value Taken Time  BP    Temp 36.6 C 01/05/22 1337  Pulse 95 01/05/22 1338  Resp    SpO2 91 % 01/05/22 1338  Vitals shown include unvalidated device data.  Last Pain:  Vitals:   01/05/22 1122  TempSrc: Oral  PainSc:          Complications: No notable events documented.

## 2022-01-05 NOTE — Op Note (Signed)
.  Preoperative diagnosis: Left ureteral stone  Postoperative diagnosis: Same  Procedure: 1 cystoscopy 2. Left retrograde pyelography 3.  Intraoperative fluoroscopy, under one hour, with interpretation 4.  Left ureteroscopic stone manipulation with laser lithotripsy 5.  Left 6 x 26 JJ stent placement  Attending: Rosie Fate  Anesthesia: General  Estimated blood loss: None  Drains: Left 6 x 26 JJ ureteral stent with tether  Specimens: stone for analysis  Antibiotics: ancef  Findings: left UPJ stone. No hydronephrosis. No masses/lesions in the bladder. Ureteral orifices in normal anatomic location.  Indications: Patient is a 51 year old female with a history of left ureteral stone who underwent left stent placement 3 weeks ago.  After discussing treatment options, she decided proceed with left ureteroscopic stone manipulation.  Procedure in detail: The patient was brought to the operating room and a brief timeout was done to ensure correct patient, correct procedure, correct site.  General anesthesia was administered patient was placed in dorsal lithotomy position.  Her genitalia was then prepped and draped in usual sterile fashion.  A rigid 26 French cystoscope was passed in the urethra and the bladder.  Bladder was inspected free masses or lesions.  the ureteral orifices were in the normal orthotopic locations.  a 6 french ureteral catheter was then instilled into the left ureteral orifice.  a gentle retrograde was obtained and findings noted above.  Using a grasper the left ureteral stent was brought to the urethral meatus. we then placed a zip wire through the ureteral stent and advanced up to the renal pelvis.  We removed the stent.we then removed the cystoscope and cannulated the left ureteral orifice with a semirigid ureteroscope.  No stone was found in the ureter. Once we reached the UPJ a sensor wire was advanced in to the renal pelvis. We then removed the ureteroscope and  advanced am 12/14 x 35cm access sheath up to the renal pelvis. We then used the flexible ureteroscope to perform nephroscopy. We encountered the stone at the UPJ. Using a 242nm laser fiber the stone was fragmented.    the fragments were then removed with a Ngage basket.    once all stone fragments were removed we then removed the access sheath under direct vision and noted no injury to the ureter. We then placed a 6 x 26 double-j ureteral stent over the original zip wire.  We then removed the wire and good coil was noted in the the renal pelvis under fluoroscopy and the bladder under direct vision. the bladder was then drained and this concluded the procedure which was well tolerated by patient.  Complications: None  Condition: Stable, extubated, transferred to PACU  Plan: Patient is to be discharged home as to follow-up in one week. Sh eis to remove her stent in 72 hours by pulling the tether

## 2022-01-05 NOTE — Anesthesia Preprocedure Evaluation (Signed)
Anesthesia Evaluation  Patient identified by MRN, date of birth, ID band Patient awake    Reviewed: Allergy & Precautions, H&P , NPO status , Patient's Chart, lab work & pertinent test results, reviewed documented beta blocker date and time   History of Anesthesia Complications (+) PONV and history of anesthetic complications  Airway Mallampati: II  TM Distance: >3 FB Neck ROM: full    Dental no notable dental hx.    Pulmonary neg pulmonary ROS,    Pulmonary exam normal breath sounds clear to auscultation       Cardiovascular Exercise Tolerance: Good hypertension, negative cardio ROS   Rhythm:regular Rate:Normal     Neuro/Psych negative neurological ROS  negative psych ROS   GI/Hepatic negative GI ROS, Neg liver ROS,   Endo/Other  negative endocrine ROSdiabetes, Type 2  Renal/GU Renal disease  negative genitourinary   Musculoskeletal   Abdominal   Peds  Hematology negative hematology ROS (+)   Anesthesia Other Findings   Reproductive/Obstetrics negative OB ROS                             Anesthesia Physical  Anesthesia Plan  ASA: 2 and emergent  Anesthesia Plan: General and General LMA   Post-op Pain Management:    Induction:   PONV Risk Score and Plan:   Airway Management Planned:   Additional Equipment:   Intra-op Plan:   Post-operative Plan:   Informed Consent: I have reviewed the patients History and Physical, chart, labs and discussed the procedure including the risks, benefits and alternatives for the proposed anesthesia with the patient or authorized representative who has indicated his/her understanding and acceptance.     Dental Advisory Given  Plan Discussed with: CRNA  Anesthesia Plan Comments:         Anesthesia Quick Evaluation

## 2022-01-05 NOTE — Anesthesia Procedure Notes (Signed)
Procedure Name: LMA Insertion Date/Time: 01/05/2022 12:32 PM  Performed by: Louann Sjogren, MDPre-anesthesia Checklist: Patient identified, Emergency Drugs available and Suction available Patient Re-evaluated:Patient Re-evaluated prior to induction Oxygen Delivery Method: Circle system utilized Preoxygenation: Pre-oxygenation with 100% oxygen Induction Type: IV induction Ventilation: Mask ventilation without difficulty LMA: LMA inserted LMA Size: 5.0 Number of attempts: 1

## 2022-01-05 NOTE — Interval H&P Note (Signed)
History and Physical Interval Note:  01/05/2022 11:59 AM  Dana Lynch  has presented today for surgery, with the diagnosis of left proximal ureteral stone.  The various methods of treatment have been discussed with the patient and family. After consideration of risks, benefits and other options for treatment, the patient has consented to  Procedure(s): CYSTOSCOPY WITH RETROGRADE PYELOGRAM, URETEROSCOPY AND STENT EXCHANGE (Left) HOLMIUM LASER APPLICATION (Left) as a surgical intervention.  The patient's history has been reviewed, patient examined, no change in status, stable for surgery.  I have reviewed the patient's chart and labs.  Questions were answered to the patient's satisfaction.     [name]

## 2022-01-07 NOTE — Anesthesia Postprocedure Evaluation (Signed)
Anesthesia Post Note  Patient: Dana Lynch  Procedure(s) Performed: CYSTOSCOPY WITH RETROGRADE PYELOGRAM, URETEROSCOPY AND STENT EXCHANGE (Left: Ureter) HOLMIUM LASER APPLICATION (Left: Ureter) STONE EXTRACTION WITH BASKET (Left: Ureter)  Patient location during evaluation: Phase II Anesthesia Type: General Level of consciousness: awake Pain management: pain level controlled Vital Signs Assessment: post-procedure vital signs reviewed and stable Respiratory status: spontaneous breathing and respiratory function stable Cardiovascular status: blood pressure returned to baseline and stable Postop Assessment: no headache and no apparent nausea or vomiting Anesthetic complications: no Comments: Late entry   No notable events documented.   Last Vitals:  Vitals:   01/05/22 1337 01/05/22 1400  BP: 114/81 114/83  Pulse: 95 79  Resp: 12 15  Temp: 36.6 C (!) 36.3 C  SpO2: 91% 93%    Last Pain:  Vitals:   01/06/22 0906  TempSrc:   PainSc: 0-No pain                 Louann Sjogren

## 2022-01-12 ENCOUNTER — Encounter (HOSPITAL_COMMUNITY): Payer: Self-pay | Admitting: Urology

## 2022-01-13 LAB — CALCULI, WITH PHOTOGRAPH (CLINICAL LAB)
Calcium Oxalate Dihydrate: 20 %
Calcium Oxalate Monohydrate: 80 %
Weight Calculi: 152 mg

## 2022-01-20 DIAGNOSIS — E785 Hyperlipidemia, unspecified: Secondary | ICD-10-CM | POA: Diagnosis not present

## 2022-01-20 DIAGNOSIS — I10 Essential (primary) hypertension: Secondary | ICD-10-CM | POA: Diagnosis not present

## 2022-01-20 DIAGNOSIS — E669 Obesity, unspecified: Secondary | ICD-10-CM | POA: Diagnosis not present

## 2022-01-20 DIAGNOSIS — E1165 Type 2 diabetes mellitus with hyperglycemia: Secondary | ICD-10-CM | POA: Diagnosis not present

## 2022-01-26 ENCOUNTER — Ambulatory Visit (HOSPITAL_COMMUNITY): Payer: BC Managed Care – PPO

## 2022-02-07 ENCOUNTER — Ambulatory Visit (HOSPITAL_COMMUNITY)
Admission: RE | Admit: 2022-02-07 | Discharge: 2022-02-07 | Disposition: A | Discharge status: Home / Self Care | Payer: BC Managed Care – PPO | Source: Ambulatory Visit | Attending: Urology | Admitting: Urology

## 2022-02-07 DIAGNOSIS — Z0389 Encounter for observation for other suspected diseases and conditions ruled out: Secondary | ICD-10-CM | POA: Diagnosis not present

## 2022-02-07 DIAGNOSIS — N201 Calculus of ureter: Secondary | ICD-10-CM | POA: Insufficient documentation

## 2022-02-13 ENCOUNTER — Ambulatory Visit: Payer: BC Managed Care – PPO | Admitting: Urology

## 2022-02-27 ENCOUNTER — Ambulatory Visit (INDEPENDENT_AMBULATORY_CARE_PROVIDER_SITE_OTHER): Payer: BC Managed Care – PPO | Admitting: Urology

## 2022-02-27 ENCOUNTER — Encounter: Payer: Self-pay | Admitting: Urology

## 2022-02-27 VITALS — BP 117/80 | HR 92

## 2022-02-27 DIAGNOSIS — N201 Calculus of ureter: Secondary | ICD-10-CM

## 2022-02-27 DIAGNOSIS — Z87442 Personal history of urinary calculi: Secondary | ICD-10-CM

## 2022-02-27 LAB — URINALYSIS, ROUTINE W REFLEX MICROSCOPIC
Bilirubin, UA: NEGATIVE
Glucose, UA: NEGATIVE
Ketones, UA: NEGATIVE
Leukocytes,UA: NEGATIVE
Nitrite, UA: NEGATIVE
Protein,UA: NEGATIVE
RBC, UA: NEGATIVE
Specific Gravity, UA: <1.005 — ABNORMAL LOW (ref 1.005–1.030)
Urobilinogen, Ur: 0.2 mg/dL (ref 0.2–1.0)
pH, UA: 5.5 (ref 5.0–7.5)

## 2022-02-27 NOTE — Progress Notes (Unsigned)
02/27/2022 11:09 AM   Dana Lynch 1971/06/06 [id number]  Referring provider: Audley Hose, MD [address] [place],  [place] [address]  No chief complaint on file.   HPI:  F/u -   1) left ureteral stone-she was urgently stented 12/11/2021 for an 8 mm left proximal stones and concerns for infection by Dr. Diona Fanti.  Urine culture grew multiple species.  Her creatinine was 0.75.  There were no other stones.  The stone may have been visible on the scout.  She underwent left URS/HLL/stent with Dr. Crissie Sickles 01/05/2022. She pulled the stent. F/u renal US 02/07/2022 benign with a slight extrarenal pelvis on the left.    No prior kidney stones. She has a h/o recurrent UTI - symptoms of bladder pain and dark urine. Abx helped.    Today, seen for the above. UA clear. No flank pain or stone passage.    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   PMH: Past Medical History:  Diagnosis Date   Bowel habit changes 12/22/2013   Alternates constipation and diarrhea. Will refer to GI    Diabetes ([facility]) 02/16/2014   Fibroids 12/22/2013   Hypertension    Kidney stone    PONV (postoperative nausea and vomiting)    Unspecified symptom associated with female genital organs 12/22/2013    Surgical History: Past Surgical History:  Procedure Laterality Date   ABDOMINAL HYSTERECTOMY N/A 05/20/2019   Procedure: SUPRACERVICAL ABDOMINAL HYSTERECTOMY WITH LEFT SALPINGECTOMY;  Surgeon: Jonnie Kind, MD;  Location: [facility];  Service: Gynecology;  Laterality: N/A;   APPENDECTOMY     BREAST BIOPSY Left    CYSTOSCOPY WITH RETROGRADE PYELOGRAM, URETEROSCOPY AND STENT PLACEMENT Left 01/05/2022   Procedure: CYSTOSCOPY WITH RETROGRADE PYELOGRAM, URETEROSCOPY AND STENT EXCHANGE;  Surgeon: Cleon Gustin, MD;  Location: [facility];  Service: Urology;  Laterality: Left;   CYSTOSCOPY WITH STENT PLACEMENT Left 12/11/2021   Procedure: CYSTOSCOPY WITH RETROGRADE PYLEOGRAM WITH LEFT  STENT PLACEMENT;  Surgeon:  Franchot Gallo, MD;  Location: [facility];  Service: Urology;  Laterality: Left;   ENDOMETRIAL ABLATION     HOLMIUM LASER APPLICATION Left 09/22/1759   Procedure: HOLMIUM LASER APPLICATION;  Surgeon: Cleon Gustin, MD;  Location: [facility];  Service: Urology;  Laterality: Left;   STONE EXTRACTION WITH BASKET Left 01/05/2022   Procedure: STONE EXTRACTION WITH BASKET;  Surgeon: Cleon Gustin, MD;  Location: [facility];  Service: Urology;  Laterality: Left;   TONSILLECTOMY AND ADENOIDECTOMY      Home Medications:  Allergies as of 02/27/2022   No Known Allergies      Medication List        Accurate as of February 27, 2022 11:09 AM. If you have any questions, ask your nurse or doctor.          atorvastatin 80 MG tablet Commonly known as: LIPITOR Take 80 mg by mouth every evening.   Basaglar KwikPen 100 UNIT/ML Inject 10 Units into the skin daily.   blood glucose meter kit and supplies Kit Dispense based on patient and insurance preference. Use up to four times daily as directed. (FOR ICD-10 E11.65)   cefdinir 300 MG capsule Commonly known as: OMNICEF Take 1 capsule (300 mg total) by mouth every 12 (twelve) hours.   glimepiride 2 MG tablet Commonly known as: AMARYL Take 2 mg by mouth 2 (two) times daily.   hydrochlorothiazide 25 MG tablet Commonly known as: HYDRODIURIL Take 1 tablet (25 mg total) by mouth daily. For postop  fluid retention.   lisinopril 5 MG tablet Commonly known as: ZESTRIL TAKE 1 TABLET(5 MG) BY MOUTH DAILY   metFORMIN 500 MG 24 hr tablet Commonly known as: GLUCOPHAGE-XR TAKE 1 TABLET(500 MG) BY MOUTH TWICE DAILY AFTER A MEAL   ondansetron 4 MG tablet Commonly known as: Zofran Take 1 tablet (4 mg total) by mouth daily as needed for nausea or vomiting.   OneTouch Verio test strip Generic drug: glucose blood Use as instructed   OneTouch Verio w/Device Kit 1 each by Does not apply route as needed.   oxyCODONE 5 MG immediate release  tablet Commonly known as: Oxy IR/ROXICODONE Take 1 tablet (5 mg total) by mouth every 6 (six) hours as needed for moderate pain.   simvastatin 20 MG tablet Commonly known as: ZOCOR Take 1 tablet (20 mg total) by mouth daily at 6 PM.   Trulicity 4.5 UQ/3.3HL Sopn Generic drug: Dulaglutide Inject 4.5 mg into the skin every Monday.        Allergies: No Known Allergies  Family History: Family History  Problem Relation Age of Onset   Diabetes Mother    Diabetes Maternal Grandmother    Hypertension Maternal Grandmother    Cancer Maternal Grandfather        prostate   Heart disease Paternal Grandmother    Diabetes Paternal Grandmother    Heart disease Paternal Grandfather    Breast cancer Maternal Aunt     Social History:  reports that she has never smoked. She has never used smokeless tobacco. She reports current alcohol use. She reports that she does not use drugs.   Physical Exam: LMP  (LMP Unknown)   Constitutional:  Alert and oriented, No acute distress. HEENT: [place] AT, moist mucus membranes.  Trachea midline, no masses. Cardiovascular: No clubbing, cyanosis, or edema. Respiratory: Normal respiratory effort, no increased work of breathing. GI: Abdomen is soft, nontender, nondistended, no abdominal masses GU: No CVA tenderness Skin: No rashes, bruises or suspicious lesions. Neurologic: Grossly intact, no focal deficits, moving all 4 extremities. Psychiatric: Normal mood and affect.  Laboratory Data: Lab Results  Component Value Date   WBC 8.3 12/12/2021   HGB 13.9 12/12/2021   HCT 41.4 12/12/2021   MCV 89.4 12/12/2021   PLT 225 12/12/2021    Lab Results  Component Value Date   CREATININE 0.60 12/12/2021    No results found for: "PSA"  No results found for: "TESTOSTERONE"  Lab Results  Component Value Date   HGBA1C 7.2 (H) 12/10/2021    Urinalysis    Component Value Date/Time   COLORURINE AMBER (A) 12/10/2021 2012   APPEARANCEUR Cloudy (A)  12/26/2021 1333   LABSPEC 1.026 12/10/2021 2012   PHURINE 7.0 12/10/2021 2012   GLUCOSEU 2+ (A) 12/26/2021 1333   HGBUR LARGE (A) 12/10/2021 2012   BILIRUBINUR Negative 12/26/2021 1333   KETONESUR 20 (A) 12/10/2021 2012   PROTEINUR 2+ (A) 12/26/2021 1333   PROTEINUR 100 (A) 12/10/2021 2012   UROBILINOGEN 1.0 03/15/2008 2305   NITRITE Negative 12/26/2021 1333   NITRITE NEGATIVE 12/10/2021 2012   LEUKOCYTESUR Negative 12/26/2021 1333   LEUKOCYTESUR TRACE (A) 12/10/2021 2012    Lab Results  Component Value Date   LABMICR See below: 12/26/2021   WBCUA None seen 12/26/2021   LABEPIT None seen 12/26/2021   BACTERIA None seen 12/26/2021    Pertinent Imaging: Renal US and CT scan images - 2023   Results for orders placed during the hospital encounter of 02/07/22  US RENAL  Narrative CLINICAL DATA:  Left UPJ stone seen on the prior CT. Status post scope.  EXAM: RENAL / URINARY TRACT ULTRASOUND COMPLETE  COMPARISON:  CT abdomen pelvis dated 12/10/2021.  FINDINGS: Right Kidney:  Renal measurements: 11.9 x 6.8 x 7.0 cm = volume: 296 mL. Normal echogenicity. No hydronephrosis or shadowing stone.  Left Kidney:  Renal measurements: 12.3 x 5.9 x 6.2 cm = volume: 238 mL. Normal echogenicity. Mild pelviectasis. No hydronephrosis or shadowing stone.  Bladder:  Appears normal for degree of bladder distention.  Other:  None.  IMPRESSION: Mild fullness of the left renal pelvis, otherwise unremarkable renal ultrasound.   Electronically Signed By: Anner Crete M.D. On: 02/07/2022 19:25   Assessment & Plan:    1. Ureteral stone  Resolved. Discussed dietary changes to prevent stones. She asked about metformin and kidney function and I referred her back to her medical doctors.   - Urinalysis, Routine w reflex microscopic   No follow-ups on file.  Festus Aloe, MD  [facility]  [address] [place], [place] [address] [phone]

## 2022-03-06 DIAGNOSIS — R7401 Elevation of levels of liver transaminase levels: Secondary | ICD-10-CM | POA: Diagnosis not present

## 2022-03-06 DIAGNOSIS — E1165 Type 2 diabetes mellitus with hyperglycemia: Secondary | ICD-10-CM | POA: Diagnosis not present

## 2022-03-06 DIAGNOSIS — E785 Hyperlipidemia, unspecified: Secondary | ICD-10-CM | POA: Diagnosis not present

## 2022-03-06 DIAGNOSIS — I10 Essential (primary) hypertension: Secondary | ICD-10-CM | POA: Diagnosis not present

## 2022-04-19 DIAGNOSIS — H43813 Vitreous degeneration, bilateral: Secondary | ICD-10-CM | POA: Diagnosis not present

## 2022-05-08 DIAGNOSIS — R7401 Elevation of levels of liver transaminase levels: Secondary | ICD-10-CM | POA: Diagnosis not present

## 2022-05-08 DIAGNOSIS — E1165 Type 2 diabetes mellitus with hyperglycemia: Secondary | ICD-10-CM | POA: Diagnosis not present

## 2022-05-08 DIAGNOSIS — E669 Obesity, unspecified: Secondary | ICD-10-CM | POA: Diagnosis not present

## 2022-05-08 DIAGNOSIS — I10 Essential (primary) hypertension: Secondary | ICD-10-CM | POA: Diagnosis not present

## 2022-05-09 ENCOUNTER — Other Ambulatory Visit: Payer: Self-pay | Admitting: Family Medicine

## 2022-05-09 DIAGNOSIS — R7401 Elevation of levels of liver transaminase levels: Secondary | ICD-10-CM

## 2022-05-22 ENCOUNTER — Ambulatory Visit
Admission: RE | Admit: 2022-05-22 | Discharge: 2022-05-22 | Disposition: A | Discharge status: Home / Self Care | Payer: BC Managed Care – PPO | Source: Ambulatory Visit | Attending: Family Medicine | Admitting: Family Medicine

## 2022-05-22 DIAGNOSIS — R7401 Elevation of levels of liver transaminase levels: Secondary | ICD-10-CM

## 2022-05-22 DIAGNOSIS — R945 Abnormal results of liver function studies: Secondary | ICD-10-CM | POA: Diagnosis not present

## 2022-05-22 DIAGNOSIS — M25512 Pain in left shoulder: Secondary | ICD-10-CM | POA: Diagnosis not present

## 2022-06-29 DIAGNOSIS — E1165 Type 2 diabetes mellitus with hyperglycemia: Secondary | ICD-10-CM | POA: Diagnosis not present

## 2022-06-29 DIAGNOSIS — I10 Essential (primary) hypertension: Secondary | ICD-10-CM | POA: Diagnosis not present

## 2022-06-29 DIAGNOSIS — R7401 Elevation of levels of liver transaminase levels: Secondary | ICD-10-CM | POA: Diagnosis not present

## 2022-06-29 DIAGNOSIS — E669 Obesity, unspecified: Secondary | ICD-10-CM | POA: Diagnosis not present

## 2022-07-12 ENCOUNTER — Other Ambulatory Visit: Payer: Self-pay | Admitting: Internal Medicine

## 2022-07-12 DIAGNOSIS — Z1231 Encounter for screening mammogram for malignant neoplasm of breast: Secondary | ICD-10-CM

## 2022-08-30 ENCOUNTER — Ambulatory Visit
Admission: RE | Admit: 2022-08-30 | Discharge: 2022-08-30 | Disposition: A | Discharge status: Home / Self Care | Payer: BC Managed Care – PPO | Source: Ambulatory Visit | Attending: Internal Medicine | Admitting: Internal Medicine

## 2022-08-30 DIAGNOSIS — Z1231 Encounter for screening mammogram for malignant neoplasm of breast: Secondary | ICD-10-CM | POA: Diagnosis not present

## 2022-10-30 DIAGNOSIS — H43812 Vitreous degeneration, left eye: Secondary | ICD-10-CM | POA: Diagnosis not present

## 2022-11-09 DIAGNOSIS — J309 Allergic rhinitis, unspecified: Secondary | ICD-10-CM | POA: Diagnosis not present

## 2022-11-09 DIAGNOSIS — E669 Obesity, unspecified: Secondary | ICD-10-CM | POA: Diagnosis not present

## 2022-11-09 DIAGNOSIS — E1165 Type 2 diabetes mellitus with hyperglycemia: Secondary | ICD-10-CM | POA: Diagnosis not present

## 2022-11-09 DIAGNOSIS — Z1329 Encounter for screening for other suspected endocrine disorder: Secondary | ICD-10-CM | POA: Diagnosis not present

## 2022-11-28 DIAGNOSIS — J019 Acute sinusitis, unspecified: Secondary | ICD-10-CM | POA: Diagnosis not present

## 2022-11-28 DIAGNOSIS — Z6834 Body mass index (BMI) 34.0-34.9, adult: Secondary | ICD-10-CM | POA: Diagnosis not present

## 2022-11-28 DIAGNOSIS — E1165 Type 2 diabetes mellitus with hyperglycemia: Secondary | ICD-10-CM | POA: Diagnosis not present

## 2022-11-28 DIAGNOSIS — E669 Obesity, unspecified: Secondary | ICD-10-CM | POA: Diagnosis not present

## 2023-02-07 DIAGNOSIS — E1165 Type 2 diabetes mellitus with hyperglycemia: Secondary | ICD-10-CM | POA: Diagnosis not present

## 2023-02-14 DIAGNOSIS — Z1389 Encounter for screening for other disorder: Secondary | ICD-10-CM | POA: Diagnosis not present

## 2023-02-14 DIAGNOSIS — E1165 Type 2 diabetes mellitus with hyperglycemia: Secondary | ICD-10-CM | POA: Diagnosis not present

## 2023-02-14 DIAGNOSIS — E669 Obesity, unspecified: Secondary | ICD-10-CM | POA: Diagnosis not present

## 2023-02-14 DIAGNOSIS — Z1331 Encounter for screening for depression: Secondary | ICD-10-CM | POA: Diagnosis not present

## 2023-02-14 DIAGNOSIS — Z6833 Body mass index (BMI) 33.0-33.9, adult: Secondary | ICD-10-CM | POA: Diagnosis not present

## 2023-07-02 DIAGNOSIS — Z13220 Encounter for screening for lipoid disorders: Secondary | ICD-10-CM | POA: Diagnosis not present

## 2023-07-02 DIAGNOSIS — Z0000 Encounter for general adult medical examination without abnormal findings: Secondary | ICD-10-CM | POA: Diagnosis not present

## 2023-07-02 DIAGNOSIS — E1165 Type 2 diabetes mellitus with hyperglycemia: Secondary | ICD-10-CM | POA: Diagnosis not present

## 2023-07-06 DIAGNOSIS — Z0000 Encounter for general adult medical examination without abnormal findings: Secondary | ICD-10-CM | POA: Diagnosis not present

## 2023-09-26 ENCOUNTER — Other Ambulatory Visit: Payer: Self-pay | Admitting: Adult Health

## 2023-09-26 DIAGNOSIS — Z1231 Encounter for screening mammogram for malignant neoplasm of breast: Secondary | ICD-10-CM

## 2023-10-04 ENCOUNTER — Ambulatory Visit
Admission: RE | Admit: 2023-10-04 | Discharge: 2023-10-04 | Disposition: A | Discharge status: Home / Self Care | Source: Ambulatory Visit | Attending: Adult Health | Admitting: Adult Health

## 2023-10-04 DIAGNOSIS — Z1231 Encounter for screening mammogram for malignant neoplasm of breast: Secondary | ICD-10-CM

## 2024-01-23 ENCOUNTER — Other Ambulatory Visit: Payer: Self-pay

## 2024-01-23 ENCOUNTER — Encounter (HOSPITAL_COMMUNITY): Payer: Self-pay | Admitting: Occupational Therapy

## 2024-01-23 ENCOUNTER — Ambulatory Visit (HOSPITAL_COMMUNITY): Attending: Family Medicine | Admitting: Occupational Therapy

## 2024-01-23 DIAGNOSIS — R29898 Other symptoms and signs involving the musculoskeletal system: Secondary | ICD-10-CM | POA: Insufficient documentation

## 2024-01-23 DIAGNOSIS — M79644 Pain in right finger(s): Secondary | ICD-10-CM | POA: Insufficient documentation

## 2024-01-23 NOTE — Therapy (Signed)
 OUTPATIENT OCCUPATIONAL THERAPY ORTHO EVALUATION  Patient Name: Dana Lynch MRN: [id number] DOB:[birth year], 53 y.o., female Today's Date: 01/23/2024    END OF SESSION:  OT End of Session - 01/23/24 1507     Visit Number 1    Number of Visits 4    Date for OT Re-Evaluation 03/23/24    Authorization Type 1) [employer] 2) [employer] state    Authorization Time Period 28 visit limit, requesting auth    OT Start Time 1425    OT Stop Time 1455    OT Time Calculation (min) 30 min    Activity Tolerance Patient tolerated treatment well    Behavior During Therapy WFL for tasks assessed/performed          Past Medical History:  Diagnosis Date   Bowel habit changes 12/22/2013   Alternates constipation and diarrhea. Will refer to GI    Diabetes ([facility]) 02/16/2014   Fibroids 12/22/2013   Hypertension    Kidney stone    PONV (postoperative nausea and vomiting)    Unspecified symptom associated with female genital organs 12/22/2013   Past Surgical History:  Procedure Laterality Date   ABDOMINAL HYSTERECTOMY N/A 05/20/2019   Procedure: SUPRACERVICAL ABDOMINAL HYSTERECTOMY WITH LEFT SALPINGECTOMY;  Surgeon: Edsel Norleen GAILS, MD;  Location: [facility];  Service: Gynecology;  Laterality: N/A;   APPENDECTOMY     BREAST BIOPSY Left    CYSTOSCOPY WITH RETROGRADE PYELOGRAM, URETEROSCOPY AND STENT PLACEMENT Left 01/05/2022   Procedure: CYSTOSCOPY WITH RETROGRADE PYELOGRAM, URETEROSCOPY AND STENT EXCHANGE;  Surgeon: Sherrilee Belvie CROME, MD;  Location: [facility];  Service: Urology;  Laterality: Left;   CYSTOSCOPY WITH STENT PLACEMENT Left 12/11/2021   Procedure: CYSTOSCOPY WITH RETROGRADE PYLEOGRAM WITH LEFT  STENT PLACEMENT;  Surgeon: Matilda Senior, MD;  Location: [facility];  Service: Urology;  Laterality: Left;   ENDOMETRIAL ABLATION     HOLMIUM LASER APPLICATION Left 01/05/2022   Procedure: HOLMIUM LASER APPLICATION;  Surgeon: Sherrilee Belvie CROME, MD;  Location: [facility];  Service: Urology;  Laterality:  Left;   STONE EXTRACTION WITH BASKET Left 01/05/2022   Procedure: STONE EXTRACTION WITH BASKET;  Surgeon: Sherrilee Belvie CROME, MD;  Location: [facility];  Service: Urology;  Laterality: Left;   TONSILLECTOMY AND ADENOIDECTOMY     Patient Active Problem List   Diagnosis Date Noted   Hypokalemia 12/11/2021   Obesity (BMI 30-39.9) 12/11/2021   Left nephrolithiasis 12/10/2021   UTI (urinary tract infection) 12/10/2021   Type 2 diabetes mellitus with hyperglycemia ([facility]) 12/10/2021   Chronic cystitis 09/03/2020   Vaginal discharge 09/03/2020   Status post abdominal supracervical subtotal hysterectomy 05/20/2019   Essential hypertension, benign 02/09/2017   Mixed hyperlipidemia 02/09/2017   Class 2 severe obesity due to excess calories with serious comorbidity and body mass index (BMI) of 36.0 to 36.9 in adult (HCC) 02/09/2017   Elevated cholesterol 01/26/2017   Encounter for gynecological examination with Papanicolaou smear of cervix 01/26/2017   Vitamin D  deficiency 01/26/2017   Uncontrolled type 2 diabetes mellitus without complication, without long-term current use of insulin  02/16/2014   Hypertension 01/14/2014   Bowel habit changes 12/22/2013   PCP: Dr. Ezekiel Charleston  REFERRING PROVIDER: Dr. Delinda Davis  ONSET DATE: 01/14/24  REFERRING DIAG: right mallet finger  THERAPY DIAG:  Pain in right finger(s)  Other symptoms and signs involving the musculoskeletal system  Rationale for Evaluation and Treatment: Rehabilitation  SUBJECTIVE:   SUBJECTIVE STATEMENT: S: This splint I got at my appointment is too big. Pt accompanied by:  self  PERTINENT HISTORY: Pt is a 53 y/o female s/p right mallet finger sustained last week after a piece of solar system equipment at school injured her finger.   PRECAUTIONS: None  WEIGHT BEARING RESTRICTIONS: Yes NWB  PAIN:  Are you having pain? No  FALLS: Has patient fallen in last 6 months? No  PLOF: Independent  PATIENT GOALS: To have  the finger heal appropriately   NEXT MD VISIT: August  OBJECTIVE:  Note: Objective measures were completed at Evaluation unless otherwise noted.  HAND DOMINANCE: Right  ADLs: Overall ADLs: Pt is completing ADLs independently. Cannot use the right 5th digit to grasp items or participate in ADLs due to pain, weakness, and the need to keep splinted in extension.    UPPER EXTREMITY ROM:     Unable to test on evaluation due to protocol  Active ROM Right eval Left eval  Thumb MCP (0-60)    Thumb IP (0-80)    Thumb Radial abd/add (0-55)     Thumb Palmar abd/add (0-45)     Thumb Opposition to Small Finger     Index MCP (0-90)     Index PIP (0-100)     Index DIP (0-70)      Long MCP (0-90)      Long PIP (0-100)      Long DIP (0-70)      Ring MCP (0-90)      Ring PIP (0-100)      Ring DIP (0-70)      Little MCP (0-90)      Little PIP (0-100)      Little DIP (0-70)      (Blank rows = not tested)   HAND FUNCTION: TBD-unable to test on evaluation  SENSATION: WFL  EDEMA: mild edema along right 5th digit  COGNITION: Overall cognitive status: Within functional limits for tasks assessed    TREATMENT DATE:  01/23/24 Splint fabrication: volar mallet splint fabricated for right 5th digit today. Sealed splint along dorsal surface along seam, provided extra strapping along distal edge of the splint for security.                                                                                                                                  PATIENT EDUCATION: Education details: splint wear and care Person educated: Patient Education method: Medical illustrator Education comprehension: verbalized understanding  HOME EXERCISE PROGRAM: Eval: splint wear and care   GOALS: Goals reviewed with patient? Yes  SHORT TERM GOALS: Target date: 02/23/24  Pt will be educated on splint wear and care to ensure optimal usage and recovery.   Goal status:  MET     ASSESSMENT:  CLINICAL IMPRESSION: Patient is a 53 y.o. female who was seen today for occupational therapy evaluation for mallet finger injury for splinting. Pt arrived with prefabricated mallet finger splint that was too large for her finger, reports DIP joint continues to flex and  slide around in the splint. Custom splint fabricated for pt's current finger size. Educated on wear and care, as well as likelihood of splint fit changing as swelling decreases.  Pt verbalized understanding.  PERFORMANCE DEFICITS: in functional skills including ADLs, IADLs, coordination, dexterity, edema, ROM, strength, pain, and UE functional use  IMPAIRMENTS: are limiting patient from ADLs, IADLs, rest and sleep, work, and leisure.   COMORBIDITIES: has no other co-morbidities that affects occupational performance. Patient will benefit from skilled OT to address above impairments and improve overall function.  MODIFICATION OR ASSISTANCE TO COMPLETE EVALUATION: No modification of tasks or assist necessary to complete an evaluation.  OT OCCUPATIONAL PROFILE AND HISTORY: Problem focused assessment: Including review of records relating to presenting problem.  CLINICAL DECISION MAKING: LOW - limited treatment options, no task modification necessary  REHAB POTENTIAL: Good  EVALUATION COMPLEXITY: Low      PLAN:  OT FREQUENCY: 1x/week  OT DURATION: 6 weeks  PLANNED INTERVENTIONS: 97168 OT Re-evaluation, 97535 self care/ADL training, 02889 therapeutic exercise, 97530 therapeutic activity, 97112 neuromuscular re-education, 97140 manual therapy, 97760 Orthotic Initial, [id number] Orthotic/Prosthetic subsequent, patient/family education, and DME and/or AE instructions  RECOMMENDED OTHER SERVICES: None at this time  CONSULTED AND AGREED WITH PLAN OF CARE: Patient  PLAN FOR NEXT SESSION: Adjust splint as needed, measure and begin skilled OT for functional use if needed   Sonny Cory, OTR/L   [phone] 01/23/2024, 3:08 PM

## 2024-09-18 ENCOUNTER — Encounter: Admitting: Adult Health

## 2024-09-22 ENCOUNTER — Encounter: Admitting: Adult Health
